# Patient Record
Sex: Male | Born: 1955 | Race: White | Hispanic: No | Marital: Married | Smoking: Never smoker
Health system: Southern US, Community
[De-identification: ages and names within clinical notes are randomized; demographics above are authoritative.]

## PROBLEM LIST (undated history)

## (undated) ENCOUNTER — Ambulatory Visit: Payer: Commercial Managed Care - PPO

## (undated) DIAGNOSIS — I10 Essential (primary) hypertension: Secondary | ICD-10-CM

## (undated) DIAGNOSIS — E119 Type 2 diabetes mellitus without complications: Secondary | ICD-10-CM

## (undated) DIAGNOSIS — Z87442 Personal history of urinary calculi: Secondary | ICD-10-CM

## (undated) DIAGNOSIS — C439 Malignant melanoma of skin, unspecified: Secondary | ICD-10-CM

## (undated) DIAGNOSIS — E785 Hyperlipidemia, unspecified: Secondary | ICD-10-CM

## (undated) DIAGNOSIS — E669 Obesity, unspecified: Secondary | ICD-10-CM

## (undated) DIAGNOSIS — M23359 Other meniscus derangements, posterior horn of lateral meniscus, unspecified knee: Secondary | ICD-10-CM

## (undated) HISTORY — DX: Essential (primary) hypertension: I10

## (undated) HISTORY — DX: Hyperlipidemia, unspecified: E78.5

## (undated) HISTORY — PX: WRIST SURGERY: SHX841

## (undated) HISTORY — DX: Type 2 diabetes mellitus without complications: E11.9

## (undated) HISTORY — DX: Malignant melanoma of skin, unspecified: C43.9

---

## 2013-08-18 DIAGNOSIS — E119 Type 2 diabetes mellitus without complications: Secondary | ICD-10-CM

## 2013-08-18 HISTORY — DX: Type 2 diabetes mellitus without complications: E11.9

## 2016-02-05 DIAGNOSIS — C439 Malignant melanoma of skin, unspecified: Secondary | ICD-10-CM

## 2016-02-05 HISTORY — PX: SKIN BIOPSY: SHX1

## 2016-02-05 HISTORY — DX: Malignant melanoma of skin, unspecified: C43.9

## 2016-02-26 ENCOUNTER — Encounter: Payer: Self-pay | Admitting: General Surgery

## 2016-02-26 ENCOUNTER — Ambulatory Visit (INDEPENDENT_AMBULATORY_CARE_PROVIDER_SITE_OTHER): Payer: BLUE CROSS/BLUE SHIELD | Admitting: General Surgery

## 2016-02-26 DIAGNOSIS — C4361 Malignant melanoma of right upper limb, including shoulder: Secondary | ICD-10-CM

## 2016-02-26 NOTE — Progress Notes (Signed)
Patient ID: [name], male   DOB: 02-09-1956, 60 y.o.   MRN: [id number]  Chief Complaint  Patient presents with  . Other    melanoma    HPI [name] is a 60 y.o. male here today for a evaluation of a right posterior upper arm melanoma.  Biopsy was done 02-05-16 by Dr Aubery Lapping. She also biopsied a place on his back that was dysplasic compound nevus. She is here today with his wife of 72 years, [name].  HPI  Past Medical History  Diagnosis Date  . Melanoma ([place]) 02-05-16    right upper arm  . Type 2 diabetes mellitus ([place]) 2015    Past Surgical History  Procedure Laterality Date  . Skin biopsy Right 02-05-16    melanoma    Family History  Problem Relation Age of Onset  . Cancer Father     lung cancer  . Heart attack Mother     Social History Social History  Substance Use Topics  . Smoking status: Never Smoker   . Smokeless tobacco: Never Used  . Alcohol Use: No    No Known Allergies  Current Outpatient Prescriptions  Medication Sig Dispense Refill  . atorvastatin (LIPITOR) 10 MG tablet TAKE 1 TABLET (10 MG TOTAL) BY MOUTH ONCE DAILY.  3  . b complex vitamins tablet Take 1 tablet by mouth daily.    Marland Kitchen glipiZIDE (GLUCOTROL XL) 10 MG 24 hr tablet TAKE 1 TABLET (10 MG TOTAL) BY MOUTH ONCE DAILY.  3  . lisinopril (PRINIVIL,ZESTRIL) 2.5 MG tablet TAKE 1 TABLET (2.5 MG TOTAL) BY MOUTH ONCE DAILY.  3  . metFORMIN (GLUCOPHAGE) 850 MG tablet TAKE 1 TABLET (850 MG TOTAL) BY MOUTH 3 (THREE) TIMES DAILY.  3  . Multiple Vitamin (MULTIVITAMIN) tablet Take 1 tablet by mouth daily.    . Omega-3 Fatty Acids (FISH OIL OMEGA-3 PO) Take by mouth daily.     No current facility-administered medications for this visit.    Review of Systems Review of Systems  Constitutional: Negative.   Respiratory: Negative.   Cardiovascular: Negative.     Blood pressure 142/74, pulse 68, resp. rate 14, height 5\' 10"  (1.778 m), weight 228 lb (103.42 kg).  Physical Exam Physical  Exam  Constitutional: He is oriented to person, place, and time. He appears well-developed and well-nourished.  HENT:  Mouth/Throat: Oropharynx is clear and moist.  Eyes: Conjunctivae are normal. No scleral icterus.  Neck: Neck supple.  Cardiovascular: Normal rate, regular rhythm and normal heart sounds.   Pulmonary/Chest: Effort normal and breath sounds normal.    Lymphadenopathy:    He has no cervical adenopathy.    He has no axillary adenopathy.       Right: No supraclavicular adenopathy present.  Symmetrical 1 cm axillary nodes bilaterally.  Neurological: He is alert and oriented to person, place, and time.  Skin: Skin is warm and dry.  T 10 left lateral back 1 cm healing area. Right posterior upper arm 1.5 cm healing area. Right posterior shoulder lipoma 3-4 cm. Small skin cyst right neck.   Psychiatric: His behavior is normal.    Data Reviewed Pathology from 02/05/2016 biopsy of the right proximal posterior upper arm showing atypical intra-epidermal and dermal melanocytic proliferation consistent with melanoma, invasive. 0.48 mm thickness. Clark level II/III. No ulceration. PT 18.  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  Assessment    Early melanoma right posterior upper arm.    Plan  The lesion is below the Cipro 0.7 mm cut off that would warrant sentinel node biopsy. Formal reexcision has been recommended. This can be completed under local anesthesia.  The patient has been asked to return  by his dermatologist for reexcision of the dysplastic nevus.     Plan office excision.  PCP:  [facility] Ref:  Dr Aubery Lapping  This information has been scribed by Gaspar Cola CMA.   Robert Bellow 02/26/2016, 10:20 PM

## 2016-02-26 NOTE — Patient Instructions (Addendum)
The patient is aware to call back for any questions or concerns. Plan office excision

## 2016-03-11 ENCOUNTER — Encounter: Payer: Self-pay | Admitting: General Surgery

## 2016-03-11 ENCOUNTER — Ambulatory Visit (INDEPENDENT_AMBULATORY_CARE_PROVIDER_SITE_OTHER): Payer: BLUE CROSS/BLUE SHIELD | Admitting: General Surgery

## 2016-03-11 VITALS — BP 120/62 | HR 68 | Resp 12 | Ht 71.0 in | Wt 230.0 lb

## 2016-03-11 DIAGNOSIS — C4361 Malignant melanoma of right upper limb, including shoulder: Secondary | ICD-10-CM | POA: Diagnosis not present

## 2016-03-11 NOTE — Patient Instructions (Addendum)
Keep area clean Use ice pack on and off for today and tomorrow for comfort Return for suture removal

## 2016-03-11 NOTE — Progress Notes (Signed)
Patient ID: [name], male   DOB: 03/20/1956, 60 y.o.   MRN: [id number]  Chief Complaint  Patient presents with  . Procedure    excision    HPI [name] is a 60 y.o. male.  Here today for excision of melanoma right arm.  HPI  Past Medical History:  Diagnosis Date  . Melanoma ([place]) 02-05-16   right upper arm  . Type 2 diabetes mellitus ([place]) 2015    Past Surgical History:  Procedure Laterality Date  . SKIN BIOPSY Right 02-05-16   melanoma    Family History  Problem Relation Age of Onset  . Cancer Father     lung cancer  . Heart attack Mother     Social History Social History  Substance Use Topics  . Smoking status: Never Smoker  . Smokeless tobacco: Never Used  . Alcohol use No    No Known Allergies  Current Outpatient Prescriptions  Medication Sig Dispense Refill  . atorvastatin (LIPITOR) 10 MG tablet TAKE 1 TABLET (10 MG TOTAL) BY MOUTH ONCE DAILY.  3  . b complex vitamins tablet Take 1 tablet by mouth daily.    Marland Kitchen glipiZIDE (GLUCOTROL XL) 10 MG 24 hr tablet TAKE 1 TABLET (10 MG TOTAL) BY MOUTH ONCE DAILY.  3  . lisinopril (PRINIVIL,ZESTRIL) 2.5 MG tablet TAKE 1 TABLET (2.5 MG TOTAL) BY MOUTH ONCE DAILY.  3  . metFORMIN (GLUCOPHAGE) 850 MG tablet TAKE 1 TABLET (850 MG TOTAL) BY MOUTH 3 (THREE) TIMES DAILY.  3  . Multiple Vitamin (MULTIVITAMIN) tablet Take 1 tablet by mouth daily.    . Omega-3 Fatty Acids (FISH OIL OMEGA-3 PO) Take by mouth daily.     No current facility-administered medications for this visit.     Review of Systems Review of Systems  Constitutional: Negative.   Respiratory: Negative.   Cardiovascular: Negative.     Blood pressure 120/62, pulse 68, resp. rate 12, height 5\' 11"  (1.803 m), weight 230 lb (104.3 kg).  Physical Exam Physical Exam  Constitutional: He is oriented to person, place, and time. He appears well-developed and well-nourished.  Musculoskeletal:       Arms: Neurological: He is alert and oriented to person,  place, and time.  Skin: Skin is warm and dry.  Psychiatric: His behavior is normal.      Assessment    Malignant melanoma right posterior upper arm.    Plan    Indication for wide excision reviewed. The area was outlined and a 3 cm wide, 6 cm long ellipse of skin that was vertically orientated was identified. 20 mL of 0.5% Xylocaine with 0.25% Marcaine with 1-200000 of epinephrine. ChloraPrep was applied to the skin. Through an elliptical incision the area was excised. Scant bleeding was noted. The deep tissue was approximated with interrupted 3-0 Vicryl figure-of-eight sutures. The skin was closed with a running 4-0 nylon simple suture. Telfa and Tegaderm dressing applied.  The patient tolerated the procedure well.  He was encouraged to apply ice intermittently for the next 24 hours for comfort. OTC pain relievers as needed.  Follow-up in 10 days for suture removal.  Guy Begin  PCP Guy Begin Olmed[place] This information has been scribed by Karie Fetch RN, BSN,BC.    [name] 03/11/2016, 4:27 PM

## 2016-03-17 ENCOUNTER — Telehealth: Payer: Self-pay | Admitting: General Surgery

## 2016-03-17 NOTE — Telephone Encounter (Signed)
Notified path showed no residual disease.  Reports essentially no pain.  To f/u on Thursday as scheduled for RN check.

## 2016-03-18 ENCOUNTER — Telehealth: Payer: Self-pay | Admitting: *Deleted

## 2016-03-18 NOTE — Telephone Encounter (Signed)
Notified patient as instructed, patient pleased. Discussed follow-up appointments, patient agrees Pathology faxed as requested.

## 2016-03-18 NOTE — Telephone Encounter (Signed)
-----   Message from Robert Bellow, MD sent at 03/17/2016  8:01 AM EDT ----- No residual melanoma.    Copy to [facility] and PCP

## 2016-03-20 ENCOUNTER — Ambulatory Visit (INDEPENDENT_AMBULATORY_CARE_PROVIDER_SITE_OTHER): Payer: BLUE CROSS/BLUE SHIELD | Admitting: *Deleted

## 2016-03-20 DIAGNOSIS — C4361 Malignant melanoma of right upper limb, including shoulder: Secondary | ICD-10-CM

## 2016-03-20 NOTE — Patient Instructions (Signed)
The patient is aware to call back for any questions or concerns.  

## 2016-03-20 NOTE — Progress Notes (Signed)
Patient came in today for a wound check/suture removal.  The wound is clean, with no signs of infection noted. Follow up as scheduled as needed. Aware of pathology.

## 2016-06-18 ENCOUNTER — Other Ambulatory Visit: Payer: Self-pay | Admitting: Orthopedic Surgery

## 2016-06-18 DIAGNOSIS — M23302 Other meniscus derangements, unspecified lateral meniscus, unspecified knee: Secondary | ICD-10-CM

## 2016-06-18 DIAGNOSIS — M25561 Pain in right knee: Secondary | ICD-10-CM

## 2016-07-01 ENCOUNTER — Ambulatory Visit
Admission: RE | Admit: 2016-07-01 | Discharge: 2016-07-01 | Disposition: A | Discharge status: Home / Self Care | Payer: BLUE CROSS/BLUE SHIELD | Source: Ambulatory Visit | Attending: Orthopedic Surgery | Admitting: Orthopedic Surgery

## 2016-07-01 ENCOUNTER — Encounter (INDEPENDENT_AMBULATORY_CARE_PROVIDER_SITE_OTHER): Payer: Self-pay

## 2016-07-01 DIAGNOSIS — M23302 Other meniscus derangements, unspecified lateral meniscus, unspecified knee: Secondary | ICD-10-CM | POA: Insufficient documentation

## 2016-07-01 DIAGNOSIS — M25561 Pain in right knee: Secondary | ICD-10-CM

## 2016-07-01 DIAGNOSIS — M94261 Chondromalacia, right knee: Secondary | ICD-10-CM | POA: Insufficient documentation

## 2017-03-19 ENCOUNTER — Encounter: Payer: Self-pay | Admitting: *Deleted

## 2017-03-20 ENCOUNTER — Encounter: Payer: Self-pay | Admitting: Anesthesiology

## 2017-03-20 ENCOUNTER — Encounter
Admission: RE | Disposition: A | Discharge status: Home / Self Care | Payer: Self-pay | Source: Ambulatory Visit | Attending: Gastroenterology

## 2017-03-20 ENCOUNTER — Ambulatory Visit
Admission: RE | Admit: 2017-03-20 | Discharge: 2017-03-20 | Disposition: A | Discharge status: Home / Self Care | Payer: BLUE CROSS/BLUE SHIELD | Source: Ambulatory Visit | Attending: Gastroenterology | Admitting: Gastroenterology

## 2017-03-20 ENCOUNTER — Ambulatory Visit: Payer: BLUE CROSS/BLUE SHIELD | Admitting: Anesthesiology

## 2017-03-20 DIAGNOSIS — K621 Rectal polyp: Secondary | ICD-10-CM | POA: Diagnosis not present

## 2017-03-20 DIAGNOSIS — Z1211 Encounter for screening for malignant neoplasm of colon: Secondary | ICD-10-CM | POA: Insufficient documentation

## 2017-03-20 DIAGNOSIS — Z7984 Long term (current) use of oral hypoglycemic drugs: Secondary | ICD-10-CM | POA: Insufficient documentation

## 2017-03-20 DIAGNOSIS — E119 Type 2 diabetes mellitus without complications: Secondary | ICD-10-CM | POA: Diagnosis not present

## 2017-03-20 DIAGNOSIS — D124 Benign neoplasm of descending colon: Secondary | ICD-10-CM | POA: Diagnosis not present

## 2017-03-20 DIAGNOSIS — Z79899 Other long term (current) drug therapy: Secondary | ICD-10-CM | POA: Diagnosis not present

## 2017-03-20 DIAGNOSIS — D123 Benign neoplasm of transverse colon: Secondary | ICD-10-CM | POA: Insufficient documentation

## 2017-03-20 DIAGNOSIS — E669 Obesity, unspecified: Secondary | ICD-10-CM | POA: Insufficient documentation

## 2017-03-20 DIAGNOSIS — Z7982 Long term (current) use of aspirin: Secondary | ICD-10-CM | POA: Diagnosis not present

## 2017-03-20 DIAGNOSIS — Z6832 Body mass index (BMI) 32.0-32.9, adult: Secondary | ICD-10-CM | POA: Insufficient documentation

## 2017-03-20 DIAGNOSIS — K640 First degree hemorrhoids: Secondary | ICD-10-CM | POA: Insufficient documentation

## 2017-03-20 DIAGNOSIS — K5790 Diverticulosis of intestine, part unspecified, without perforation or abscess without bleeding: Secondary | ICD-10-CM | POA: Diagnosis not present

## 2017-03-20 DIAGNOSIS — Z85820 Personal history of malignant melanoma of skin: Secondary | ICD-10-CM | POA: Diagnosis not present

## 2017-03-20 HISTORY — DX: Obesity, unspecified: E66.9

## 2017-03-20 HISTORY — DX: Other meniscus derangements, posterior horn of lateral meniscus, unspecified knee: M23.359

## 2017-03-20 HISTORY — PX: COLONOSCOPY WITH PROPOFOL: SHX5780

## 2017-03-20 LAB — GLUCOSE, CAPILLARY: GLUCOSE-CAPILLARY: 128 mg/dL — AB (ref 65–99)

## 2017-03-20 SURGERY — COLONOSCOPY WITH PROPOFOL
Anesthesia: General

## 2017-03-20 MED ORDER — LIDOCAINE HCL (PF) 2 % IJ SOLN
INTRAMUSCULAR | Status: DC | PRN
  Administered 2017-03-20: 50 mg

## 2017-03-20 MED ORDER — SODIUM CHLORIDE 0.9 % IV SOLN: INTRAVENOUS | Status: DC

## 2017-03-20 MED ORDER — MIDAZOLAM HCL 2 MG/2ML IJ SOLN
INTRAMUSCULAR | Status: AC
  Filled 2017-03-20: qty 2

## 2017-03-20 MED ORDER — SODIUM CHLORIDE 0.9 % IV SOLN
INTRAVENOUS | Status: DC
  Administered 2017-03-20: 1000 mL via INTRAVENOUS

## 2017-03-20 MED ORDER — MIDAZOLAM HCL 5 MG/5ML IJ SOLN
INTRAMUSCULAR | Status: DC | PRN
  Administered 2017-03-20: 2 mg via INTRAVENOUS

## 2017-03-20 MED ORDER — PROPOFOL 500 MG/50ML IV EMUL
INTRAVENOUS | Status: DC | PRN
  Administered 2017-03-20: 75 ug/kg/min via INTRAVENOUS

## 2017-03-20 MED ORDER — PROPOFOL 10 MG/ML IV BOLUS
INTRAVENOUS | Status: AC
  Filled 2017-03-20: qty 20

## 2017-03-20 MED ORDER — FENTANYL CITRATE (PF) 100 MCG/2ML IJ SOLN
INTRAMUSCULAR | Status: DC | PRN
  Administered 2017-03-20 (×2): 50 ug via INTRAVENOUS

## 2017-03-20 MED ORDER — PROPOFOL 500 MG/50ML IV EMUL
INTRAVENOUS | Status: AC
  Filled 2017-03-20: qty 50

## 2017-03-20 MED ORDER — PROPOFOL 10 MG/ML IV BOLUS
INTRAVENOUS | Status: DC | PRN
Start: 2017-03-20 — End: 2017-03-20
  Administered 2017-03-20: 20 mg via INTRAVENOUS
  Administered 2017-03-20: 30 mg via INTRAVENOUS

## 2017-03-20 MED ORDER — FENTANYL CITRATE (PF) 100 MCG/2ML IJ SOLN
INTRAMUSCULAR | Status: AC
  Filled 2017-03-20: qty 2

## 2017-03-20 NOTE — Anesthesia Preprocedure Evaluation (Addendum)
Anesthesia Evaluation  Patient identified by MRN, date of birth, ID band Patient awake    Reviewed: Allergy & Precautions, NPO status , Patient's Chart, lab work & pertinent test results, reviewed documented beta blocker date and time   Airway Mallampati: II  TM Distance: >3 FB     Dental  (+) Chipped, Partial Upper   Pulmonary           Cardiovascular      Neuro/Psych    GI/Hepatic   Endo/Other  diabetes, Type 2  Renal/GU      Musculoskeletal   Abdominal   Peds  Hematology   Anesthesia Other Findings   Reproductive/Obstetrics                            Anesthesia Physical Anesthesia Plan  ASA: III  Anesthesia Plan: General   Post-op Pain Management:    Induction: Intravenous  PONV Risk Score and Plan:   Airway Management Planned:   Additional Equipment:   Intra-op Plan:   Post-operative Plan:   Informed Consent: I have reviewed the patients History and Physical, chart, labs and discussed the procedure including the risks, benefits and alternatives for the proposed anesthesia with the patient or authorized representative who has indicated his/her understanding and acceptance.     Plan Discussed with: CRNA  Anesthesia Plan Comments:         Anesthesia Quick Evaluation

## 2017-03-20 NOTE — H&P (Signed)
Outpatient short stay form Pre-procedure 03/20/2017 11:04 AM [name]  Primary Physician: Dr. Johny Drilling  Reason for visit:  [name]  History of present illness:  [name] He tolerated his prep well. He takes 81 mg aspirin that has been held for A couple of days. He takes no other aspirin products or blood thinning agents.      Current Facility-Administered Medications:  .  0.9 %  sodium chloride infusion, , Intravenous, Continuous, Brandon Sails, Preston, Last Rate: 20 mL/hr at 03/20/17 1011, 1,000 mL at 03/20/17 1011 .  0.9 %  sodium chloride infusion, , Intravenous, Continuous, Brandon Sails, Preston  Prescriptions Prior to Admission  Medication Sig Dispense Refill Last Dose  . aspirin EC 81 MG tablet Take 81 mg by mouth daily.   Past Week at Unknown time  . atorvastatin (LIPITOR) 10 MG tablet TAKE 1 TABLET (10 MG TOTAL) BY MOUTH ONCE DAILY.  3 Past Week at Unknown time  . b complex vitamins tablet Take 1 tablet by mouth daily.   Past Week at Unknown time  . glipiZIDE (GLUCOTROL XL) 10 MG 24 hr tablet TAKE 1 TABLET (10 MG TOTAL) BY MOUTH ONCE DAILY.  3 Past Week at Unknown time  . lisinopril (PRINIVIL,ZESTRIL) 2.5 MG tablet TAKE 1 TABLET (2.5 MG TOTAL) BY MOUTH ONCE DAILY.  3 Past Week at Unknown time  . metFORMIN (GLUCOPHAGE) 850 MG tablet TAKE 1 TABLET (850 MG TOTAL) BY MOUTH 3 (THREE) TIMES DAILY.  3 Past Week at Unknown time  . Multiple Vitamin (MULTIVITAMIN) tablet Take 1 tablet by mouth daily.   Past Week at Unknown time  . Omega-3 Fatty Acids (FISH OIL OMEGA-3 PO) Take by mouth daily.   Past Week at Unknown time     No Known Allergies   Past Medical History:  Diagnosis Date  . 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 Melanoma ([place]) 02-05-16   right upper arm  . Obesity   . Type 2 diabetes mellitus ([place]) 2015    Review of systems:      Physical Exam    Heart and lungs: Regular rate and rhythm without rub or gallop, lungs are bilaterally clear.    HEENT: Normocephalic atraumatic eyes are anicteric    Other:     Pertinant exam for procedure: Soft nontender nondistended bowel sounds positive normoactive.    Planned proceedures: Colonoscopy and indicated procedures. I have discussed the risks benefits and complications of procedures to include not limited to bleeding, infection, perforation and the risk of sedation and the patient wishes to proceed.    Brandon Sails, [facility] 03/20/2017  11:04 AM

## 2017-03-20 NOTE — Op Note (Addendum)
[facility] Patient Name: [name] Procedure Date: 03/20/2017 10:52 AM MRN: [id number] Account #: [id number] Date of Birth: 1955/08/27 Admit Type: Outpatient Age: 61 Room: [facility] ENDO ROOM 1 Gender: Male Note Status: Finalized Procedure:            Colonoscopy Indications:          Screening for colorectal malignant neoplasm, This is                        the patient's first colonoscopy Providers:            Lollie Sails, MD Referring MD:         Wynona Canes. Kym Groom, MD (Referring MD) Medicines:            Monitored Anesthesia Care Complications:        No immediate complications. Procedure:            Pre-Anesthesia Assessment:                       - ASA Grade Assessment: II - A patient with mild                        systemic disease.                       After obtaining informed consent, the colonoscope was                        passed under direct vision. Throughout the procedure,                        the patient's blood pressure, pulse, and oxygen                        saturations were monitored continuously. The                        Colonoscope was introduced through the anus and                        advanced to the the cecum, identified by appendiceal                        orifice and ileocecal valve. The colonoscopy was                        performed with moderate difficulty due to a tortuous                        colon. Successful completion of the procedure was aided                        by changing the patient to a prone position and lavage.                        The quality of the bowel preparation was good except                        the ascending colon was fair. Findings:      A 3 mm polyp was found in  the proximal descending colon. The polyp was       sessile. The polyp was removed with a cold biopsy forceps. Resection and       retrieval were complete.      A 3 mm polyp was found in the proximal transverse  colon. The polyp was       sessile. The polyp was removed with a cold biopsy forceps. Resection and       retrieval were complete.      Two sessile polyps were found in the rectum. The polyps were 1 to 2 mm       in size. These polyps were removed with a cold biopsy forceps. Resection       and retrieval were complete.      Non-bleeding internal hemorrhoids were found during retroflexion and       during anoscopy. The hemorrhoids were small and Grade I (internal       hemorrhoids that do not prolapse).      The digital rectal exam was normal.      A few small-mouthed diverticula were found in the sigmoid colon and       distal descending colon. Impression:           - One 3 mm polyp in the proximal descending colon,                        removed with a cold biopsy forceps. Resected and                        retrieved.                       - One 3 mm polyp in the proximal transverse colon,                        removed with a cold biopsy forceps. Resected and                        retrieved.                       - Two 1 to 2 mm polyps in the rectum, removed with a                        cold biopsy forceps. Resected and retrieved.                       - Non-bleeding internal hemorrhoids. Recommendation:       - Discharge patient to home.                       - Advance diet as tolerated. Procedure Code(s):    --- Professional ---                       [phone], Colonoscopy, flexible; with biopsy, single or                        multiple Diagnosis Code(s):    --- Professional ---                       Z12.11, Encounter for screening for malignant neoplasm  of colon                       D12.4, Benign neoplasm of descending colon                       D12.3, Benign neoplasm of transverse colon (hepatic                        flexure or splenic flexure)                       K62.1, Rectal polyp                       K64.0, First degree hemorrhoids CPT  copyright 2016 American Medical Association. All rights reserved. The codes documented in this report are preliminary and upon coder review may  be revised to meet current compliance requirements. Lollie Sails, MD 03/20/2017 11:44:54 AM This report has been signed electronically. Number of Addenda: 0 Note Initiated On: 03/20/2017 10:52 AM Scope Withdrawal Time: 0 hours 11 minutes 18 seconds  Total Procedure Duration: 0 hours 28 minutes 50 seconds       [facility]

## 2017-03-20 NOTE — Anesthesia Post-op Follow-up Note (Cosign Needed)
Anesthesia QCDR form completed.        

## 2017-03-20 NOTE — Transfer of Care (Signed)
Immediate Anesthesia Transfer of Care Note  Patient: [name]  Procedure(s) Performed: Procedure(s): COLONOSCOPY WITH PROPOFOL (N/A)  Patient Location: PACU  Anesthesia Type:General  Level of Consciousness: sedated  Airway & Oxygen Therapy: Patient Spontanous Breathing and Patient connected to nasal cannula oxygen  Post-op Assessment: Report given to RN and Post -op Vital signs reviewed and stable  Post vital signs: Reviewed and stable  Last Vitals:  Vitals:   03/20/17 0959  BP: (!) 143/78  Pulse: 99  Resp: 16  Temp: (!) 36.1 C    Last Pain:  Vitals:   03/20/17 0959  TempSrc: Tympanic         Complications: No apparent anesthesia complications

## 2017-03-20 NOTE — Anesthesia Postprocedure Evaluation (Signed)
Anesthesia Post Note  Patient: [name]  Procedure(s) Performed: Procedure(s) (LRB): COLONOSCOPY WITH PROPOFOL (N/A)  Patient location during evaluation: Endoscopy Anesthesia Type: General Level of consciousness: awake and alert Pain management: pain level controlled Vital Signs Assessment: post-procedure vital signs reviewed and stable Respiratory status: spontaneous breathing, nonlabored ventilation, respiratory function stable and patient connected to nasal cannula oxygen Cardiovascular status: blood pressure returned to baseline and stable Postop Assessment: no signs of nausea or vomiting Anesthetic complications: no     Last Vitals:  Vitals:   03/20/17 1142 03/20/17 1152  BP: 102/69 110/79  Pulse: 83 78  Resp: 14 13  Temp: (!) 35.7 C     Last Pain:  Vitals:   03/20/17 1142  TempSrc: Tympanic                 Vickie Ponds S

## 2017-03-23 ENCOUNTER — Encounter: Payer: Self-pay | Admitting: Gastroenterology

## 2017-03-23 LAB — SURGICAL PATHOLOGY

## 2021-06-12 ENCOUNTER — Other Ambulatory Visit: Payer: Self-pay

## 2021-06-12 ENCOUNTER — Other Ambulatory Visit: Payer: Self-pay | Admitting: Family Medicine

## 2021-06-12 ENCOUNTER — Ambulatory Visit
Admission: RE | Admit: 2021-06-12 | Discharge: 2021-06-12 | Disposition: A | Discharge status: Home / Self Care | Payer: Commercial Managed Care - PPO | Source: Ambulatory Visit | Attending: Family Medicine | Admitting: Family Medicine

## 2021-06-12 ENCOUNTER — Other Ambulatory Visit (HOSPITAL_COMMUNITY): Payer: Self-pay | Admitting: Family Medicine

## 2021-06-12 DIAGNOSIS — R109 Unspecified abdominal pain: Secondary | ICD-10-CM

## 2021-06-12 DIAGNOSIS — R1030 Lower abdominal pain, unspecified: Secondary | ICD-10-CM | POA: Diagnosis not present

## 2021-06-13 NOTE — H&P (View-Only) (Signed)
06/14/2021 10:57 AM   [name] 09/15/1955 [id number]  Referring provider: Valera Castle, MD [address] [place],  [place] [address]  Chief Complaint  Patient presents with   Hydronephrosis    HPI: 65 year old male who presents today for further evaluation of ureteral calculus.  He underwent a noncontrast CT scan tow days ago which showed mild left hydronephrosis and hydroureter secondary to 6 mm left proximal ureteral calculus.  He reports today that his pain started about 1 weeks ago while [place] the [place].  It was severe and comes and goes.  Yesterday was bad day.    No N/V/ fevers, chills.  Possible very remote history of stones 30+ years ago passes spontaneously.    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 If you have any questions, ask your nurse or doctor.          aspirin EC 81 MG tablet Take 81 mg by mouth daily.   atorvastatin 40 MG tablet Commonly known as: LIPITOR Take 40 mg by mouth daily. What changed: Another medication with the same name was removed. Continue taking this medication, and follow the directions you see here. Changed by: Hollice Espy, MD   b complex vitamins tablet Take 1 tablet by mouth daily.   FISH OIL OMEGA-3  PO Take by mouth daily.   glipiZIDE 10 MG 24 hr tablet Commonly known as: GLUCOTROL XL TAKE 1 TABLET (10 MG TOTAL) BY MOUTH ONCE DAILY.   ibuprofen 800 MG tablet Commonly known as: ADVIL Take 800 mg by mouth every 8 (eight) hours as needed.   lisinopril 20 MG tablet Commonly known as: ZESTRIL Take 20 mg by mouth 2 (two) times daily. What changed: Another medication with the same name was removed. Continue taking this medication, and follow the directions you see here. Changed by: Hollice Espy, MD   metFORMIN 850 MG tablet Commonly known as: GLUCOPHAGE TAKE 1 TABLET (850 MG TOTAL) BY MOUTH 3 (THREE) TIMES DAILY.   multivitamin tablet Take 1 tablet by mouth daily.   tamsulosin 0.4 MG Caps capsule Commonly known as: FLOMAX Take by mouth.        Allergies: No Known Allergies  Family History: Family History  Problem Relation Age of Onset   Heart attack Mother    Cancer Father        lung cancer    Social History:  reports that he has never smoked. He has never used smokeless tobacco. He reports that he does not drink alcohol and does not use drugs.   Physical Exam: BP 116/71   Pulse 86   Ht 5\' 10"  (1.778 m)   Wt  235 lb (106.6 kg)   BMI 33.72 kg/m   Constitutional:  Alert and oriented, No acute distress. HEENT: [place] AT, moist mucus membranes.  Trachea midline, no masses. Cardiovascular: No clubbing, cyanosis, or edema. Respiratory: Normal respiratory effort, no increased work of breathing. Skin: No rashes, bruises or suspicious lesions. Neurologic: Grossly intact, no focal deficits, moving all 4 extremities. Psychiatric: Normal mood and affect.  Laboratory Data: Cr 1.0 05/20/21  Urinalysis pending  Pertinent Imaging: CT RENAL STONE STUDY  Narrative CLINICAL DATA:  Left lower abdominal pain  EXAM: CT ABDOMEN AND PELVIS WITHOUT CONTRAST  TECHNIQUE: Multidetector CT imaging of the abdomen and pelvis was performed following the standard protocol  without IV contrast.  COMPARISON:  None.  FINDINGS: Lower chest: No acute abnormality.  Coronary vascular calcification.  Hepatobiliary: No focal liver abnormality is seen. No gallstones, gallbladder wall thickening, or biliary dilatation.  Pancreas: Unremarkable. No pancreatic ductal dilatation or surrounding inflammatory changes.  Spleen: Normal in size without focal abnormality.  Adrenals/Urinary Tract: Adrenal glands are normal. Mild left hydronephrosis and hydroureter, secondary to a 3 x 6 mm stone in the proximal left ureter about 4 cm distal to the left UPJ. The urinary bladder is unremarkable.  Stomach/Bowel: Stomach is within normal limits. Appendix appears normal. No evidence of bowel wall thickening, distention, or inflammatory changes.  Vascular/Lymphatic: Mild aortic atherosclerosis. No aneurysm. No suspicious nodes.  Reproductive: Prostate is unremarkable.  Other: Negative for pelvic effusion or free air. Small fat containing inguinal hernias. Small fat containing periumbilical hernia.  Musculoskeletal: No acute or significant osseous findings.  IMPRESSION: 1. Mild left hydronephrosis and hydroureter secondary to a 3 x 6 mm stone in the proximal left ureter about 4 cm distal to the left UVJ.  These results will be called to the ordering clinician or representative by the Radiologist Assistant, and communication documented in the PACS or [employer].   Electronically Signed By: Donavan Foil M.D. On: 06/12/2021 17:43  CT scan personally reviewed, agree with radiological interpretation.  This was reviewed with the patient as well today in the office.    Assessment & Plan:    1. Left ureteral stone 6 mm prox/ mid ureteral stone without infection  We discussed various treatment options for urolithiasis including observation with or without medical expulsive therapy, shockwave lithotripsy (SWL), ureteroscopy and laser lithotripsy with stent  placement, and percutaneous nephrolithotomy.   We discussed that management is based on stone size, location, density, patient co-morbidities, and patient preference.    Stones <40mm in size have a >80% spontaneous passage rate. Data surrounding the use of tamsulosin for medical expulsive therapy is controversial, but meta analyses suggests it is most efficacious for distal stones between 5-65mm in size. Possible side effects include dizziness/lightheadedness, and retrograde ejaculation.   SWL has a lower stone free rate in a single procedure, but also a lower complication rate compared to ureteroscopy and avoids a stent and associated stent related symptoms. Possible complications include renal hematoma, steinstrasse, and need for additional treatment. We discussed the role of his increased skin to stone distance can lead to decreased efficacy with shockwave lithotripsy.   Ureteroscopy with laser lithotripsy and stent placement has a higher stone free rate than SWL in a single procedure, however increased complication rate including possible infection, ureteral injury, bleeding, and stent related morbidity. Common stent related symptoms include dysuria, urgency/frequency, and flank pain.   After an extensive discussion of the risks and benefits of the above treatment options, the patient would  like to proceed with left ESWL  Warning symptoms reviewed, all questions answered  - Urinalysis, Complete w Microscopic (For [facility] ONLY); Future - Urine culture; Future   Hollice Espy, MD  [facility] [address] [place], [place] [address] [phone]

## 2021-06-13 NOTE — Progress Notes (Signed)
06/14/2021 10:57 AM   [name] 08/24/1955 [id number]  Referring provider: Valera Castle, MD [address] [place],  [place] [address]  Chief Complaint  Patient presents with   Hydronephrosis    HPI: 65 year old male who presents today for further evaluation of ureteral calculus.  He underwent a noncontrast CT scan tow days ago which showed mild left hydronephrosis and hydroureter secondary to 6 mm left proximal ureteral calculus.  He reports today that his pain started about 1 weeks ago while [place] the mountains.  It was severe and comes and goes.  Yesterday was bad day.    No N/V/ fevers, chills.  Possible very remote history of stones 30+ years ago passes spontaneously.    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 If you have any questions, ask your nurse or doctor.          aspirin EC 81 MG tablet Take 81 mg by mouth daily.   atorvastatin 40 MG tablet Commonly known as: LIPITOR Take 40 mg by mouth daily. What changed: Another medication with the same name was removed. Continue taking this medication, and follow the directions you see here. Changed by: Hollice Espy, MD   b complex vitamins tablet Take 1 tablet by mouth daily.   FISH OIL OMEGA-3  PO Take by mouth daily.   glipiZIDE 10 MG 24 hr tablet Commonly known as: GLUCOTROL XL TAKE 1 TABLET (10 MG TOTAL) BY MOUTH ONCE DAILY.   ibuprofen 800 MG tablet Commonly known as: ADVIL Take 800 mg by mouth every 8 (eight) hours as needed.   lisinopril 20 MG tablet Commonly known as: ZESTRIL Take 20 mg by mouth 2 (two) times daily. What changed: Another medication with the same name was removed. Continue taking this medication, and follow the directions you see here. Changed by: Hollice Espy, MD   metFORMIN 850 MG tablet Commonly known as: GLUCOPHAGE TAKE 1 TABLET (850 MG TOTAL) BY MOUTH 3 (THREE) TIMES DAILY.   multivitamin tablet Take 1 tablet by mouth daily.   tamsulosin 0.4 MG Caps capsule Commonly known as: FLOMAX Take by mouth.        Allergies: No Known Allergies  Family History: Family History  Problem Relation Age of Onset   Heart attack Mother    Cancer Father        lung cancer    Social History:  reports that he has never smoked. He has never used smokeless tobacco. He reports that he does not drink alcohol and does not use drugs.   Physical Exam: BP 116/71   Pulse 86   Ht 5\' 10"  (1.778 m)   Wt  235 lb (106.6 kg)   BMI 33.72 kg/m   Constitutional:  Alert and oriented, No acute distress. HEENT: [place] AT, moist mucus membranes.  Trachea midline, no masses. Cardiovascular: No clubbing, cyanosis, or edema. Respiratory: Normal respiratory effort, no increased work of breathing. Skin: No rashes, bruises or suspicious lesions. Neurologic: Grossly intact, no focal deficits, moving all 4 extremities. Psychiatric: Normal mood and affect.  Laboratory Data: Cr 1.0 05/20/21  Urinalysis pending  Pertinent Imaging: CT RENAL STONE STUDY  Narrative CLINICAL DATA:  Left lower abdominal pain  EXAM: CT ABDOMEN AND PELVIS WITHOUT CONTRAST  TECHNIQUE: Multidetector CT imaging of the abdomen and pelvis was performed following the standard protocol  without IV contrast.  COMPARISON:  None.  FINDINGS: Lower chest: No acute abnormality.  Coronary vascular calcification.  Hepatobiliary: No focal liver abnormality is seen. No gallstones, gallbladder wall thickening, or biliary dilatation.  Pancreas: Unremarkable. No pancreatic ductal dilatation or surrounding inflammatory changes.  Spleen: Normal in size without focal abnormality.  Adrenals/Urinary Tract: Adrenal glands are normal. Mild left hydronephrosis and hydroureter, secondary to a 3 x 6 mm stone in the proximal left ureter about 4 cm distal to the left UPJ. The urinary bladder is unremarkable.  Stomach/Bowel: Stomach is within normal limits. Appendix appears normal. No evidence of bowel wall thickening, distention, or inflammatory changes.  Vascular/Lymphatic: Mild aortic atherosclerosis. No aneurysm. No suspicious nodes.  Reproductive: Prostate is unremarkable.  Other: Negative for pelvic effusion or free air. Small fat containing inguinal hernias. Small fat containing periumbilical hernia.  Musculoskeletal: No acute or significant osseous findings.  IMPRESSION: 1. Mild left hydronephrosis and hydroureter secondary to a 3 x 6 mm stone in the proximal left ureter about 4 cm distal to the left UVJ.  These results will be called to the ordering clinician or representative by the Radiologist Assistant, and communication documented in the PACS or [employer].   Electronically Signed By: Donavan Foil M.D. On: 06/12/2021 17:43  CT scan personally reviewed, agree with radiological interpretation.  This was reviewed with the patient as well today in the office.    Assessment & Plan:    1. Left ureteral stone 6 mm prox/ mid ureteral stone without infection  We discussed various treatment options for urolithiasis including observation with or without medical expulsive therapy, shockwave lithotripsy (SWL), ureteroscopy and laser lithotripsy with stent  placement, and percutaneous nephrolithotomy.   We discussed that management is based on stone size, location, density, patient co-morbidities, and patient preference.    Stones <71mm in size have a >80% spontaneous passage rate. Data surrounding the use of tamsulosin for medical expulsive therapy is controversial, but meta analyses suggests it is most efficacious for distal stones between 5-42mm in size. Possible side effects include dizziness/lightheadedness, and retrograde ejaculation.   SWL has a lower stone free rate in a single procedure, but also a lower complication rate compared to ureteroscopy and avoids a stent and associated stent related symptoms. Possible complications include renal hematoma, steinstrasse, and need for additional treatment. We discussed the role of his increased skin to stone distance can lead to decreased efficacy with shockwave lithotripsy.   Ureteroscopy with laser lithotripsy and stent placement has a higher stone free rate than SWL in a single procedure, however increased complication rate including possible infection, ureteral injury, bleeding, and stent related morbidity. Common stent related symptoms include dysuria, urgency/frequency, and flank pain.   After an extensive discussion of the risks and benefits of the above treatment options, the patient would  like to proceed with left ESWL  Warning symptoms reviewed, all questions answered  - Urinalysis, Complete w Microscopic (For [facility] ONLY); Future - Urine culture; Future   Hollice Espy, MD  [facility] [address] [place], [place] [address] [phone]

## 2021-06-14 ENCOUNTER — Ambulatory Visit: Payer: Commercial Managed Care - PPO | Admitting: Urology

## 2021-06-14 ENCOUNTER — Encounter: Payer: Self-pay | Admitting: Urology

## 2021-06-14 ENCOUNTER — Other Ambulatory Visit: Payer: Self-pay

## 2021-06-14 ENCOUNTER — Other Ambulatory Visit: Payer: Self-pay | Admitting: Urology

## 2021-06-14 VITALS — BP 116/71 | HR 86 | Ht 70.0 in | Wt 235.0 lb

## 2021-06-14 DIAGNOSIS — N201 Calculus of ureter: Secondary | ICD-10-CM

## 2021-06-14 NOTE — Progress Notes (Signed)
ESWL ORDER FORM  Expected date of procedure: 06/20/21  Surgeon: Hollice Espy, MD  Post op standing: 2-4wk follow up w/KUB prior  Anticoagulation/Aspirin/NSAID standing order: Hold all 72 hours prior  Anesthesia standing order: MAC  VTE standing: SCD's  Dx: Left Ureteral Stone  Procedure: left Extracorporeal shock wave lithotripsy  CPT : 41583  Standing Order Set:   *NPO after mn, KUB  *NS 133m/hr, Keflex 504mPO, Benadryl 2537mO, Valium 8m56m, Zofran 4mg 60m   Medications if other than standing orders:   NONE

## 2021-06-20 ENCOUNTER — Ambulatory Visit: Payer: Commercial Managed Care - PPO

## 2021-06-20 ENCOUNTER — Other Ambulatory Visit: Payer: Self-pay

## 2021-06-20 ENCOUNTER — Ambulatory Visit
Admission: RE | Admit: 2021-06-20 | Discharge: 2021-06-20 | Disposition: A | Discharge status: Home / Self Care | Payer: Commercial Managed Care - PPO | Attending: Urology | Admitting: Urology

## 2021-06-20 ENCOUNTER — Encounter
Admission: RE | Disposition: A | Discharge status: Home / Self Care | Payer: Self-pay | Source: Home / Self Care | Attending: Urology

## 2021-06-20 ENCOUNTER — Telehealth: Payer: Self-pay | Admitting: Urology

## 2021-06-20 ENCOUNTER — Encounter: Payer: Self-pay | Admitting: Urology

## 2021-06-20 DIAGNOSIS — Z6833 Body mass index (BMI) 33.0-33.9, adult: Secondary | ICD-10-CM | POA: Insufficient documentation

## 2021-06-20 DIAGNOSIS — N201 Calculus of ureter: Secondary | ICD-10-CM

## 2021-06-20 DIAGNOSIS — E119 Type 2 diabetes mellitus without complications: Secondary | ICD-10-CM | POA: Diagnosis not present

## 2021-06-20 DIAGNOSIS — Z7984 Long term (current) use of oral hypoglycemic drugs: Secondary | ICD-10-CM | POA: Diagnosis not present

## 2021-06-20 DIAGNOSIS — E669 Obesity, unspecified: Secondary | ICD-10-CM | POA: Diagnosis not present

## 2021-06-20 DIAGNOSIS — N132 Hydronephrosis with renal and ureteral calculous obstruction: Secondary | ICD-10-CM | POA: Insufficient documentation

## 2021-06-20 DIAGNOSIS — Z79899 Other long term (current) drug therapy: Secondary | ICD-10-CM | POA: Insufficient documentation

## 2021-06-20 DIAGNOSIS — Z7982 Long term (current) use of aspirin: Secondary | ICD-10-CM | POA: Insufficient documentation

## 2021-06-20 HISTORY — PX: EXTRACORPOREAL SHOCK WAVE LITHOTRIPSY: SHX1557

## 2021-06-20 LAB — GLUCOSE, CAPILLARY: Glucose-Capillary: 102 mg/dL — ABNORMAL HIGH (ref 70–99)

## 2021-06-20 SURGERY — LITHOTRIPSY, ESWL
Anesthesia: General | Laterality: Left

## 2021-06-20 MED ORDER — HYDROCODONE-ACETAMINOPHEN 5-325 MG PO TABS: 1.0000 | ORAL_TABLET | Freq: Four times a day (QID) | ORAL | 0 refills | Status: DC | PRN

## 2021-06-20 MED ORDER — ONDANSETRON HCL 4 MG/2ML IJ SOLN: 4.0000 mg | Freq: Once | INTRAMUSCULAR | Status: AC

## 2021-06-20 MED ORDER — DIPHENHYDRAMINE HCL 25 MG PO CAPS: 25.0000 mg | ORAL_CAPSULE | ORAL | Status: AC

## 2021-06-20 MED ORDER — DIPHENHYDRAMINE HCL 25 MG PO CAPS
ORAL_CAPSULE | ORAL | Status: AC
  Administered 2021-06-20: 25 mg via ORAL
  Filled 2021-06-20: qty 1

## 2021-06-20 MED ORDER — CEPHALEXIN 500 MG PO CAPS: 500.0000 mg | ORAL_CAPSULE | Freq: Once | ORAL | Status: AC

## 2021-06-20 MED ORDER — CEPHALEXIN 500 MG PO CAPS
ORAL_CAPSULE | ORAL | Status: AC
  Administered 2021-06-20: 500 mg via ORAL
  Filled 2021-06-20: qty 1

## 2021-06-20 MED ORDER — CHLORHEXIDINE GLUCONATE 0.12 % MT SOLN
OROMUCOSAL | Status: AC
  Filled 2021-06-20: qty 15

## 2021-06-20 MED ORDER — ONDANSETRON HCL 4 MG/2ML IJ SOLN
INTRAMUSCULAR | Status: AC
  Administered 2021-06-20: 4 mg via INTRAVENOUS
  Filled 2021-06-20: qty 2

## 2021-06-20 MED ORDER — DIAZEPAM 5 MG PO TABS
ORAL_TABLET | ORAL | Status: AC
  Administered 2021-06-20: 10 mg via ORAL
  Filled 2021-06-20: qty 2

## 2021-06-20 MED ORDER — SODIUM CHLORIDE 0.9 % IV SOLN: INTRAVENOUS | Status: DC

## 2021-06-20 MED ORDER — DIAZEPAM 5 MG PO TABS
10.0000 mg | ORAL_TABLET | ORAL | Status: AC
Start: 2021-06-20 — End: 2021-06-20

## 2021-06-20 NOTE — Telephone Encounter (Signed)
Mr. [name] was very difficult to see at the time of lithotripsy however there was a clear transition in the proximal ureter at the location of the Preston on previous CT when contrast was administered.  We ended up treating the transition point rather than visualization of the Preston.  Traditionally, will follow up with KUB in 2 weeks however I do not believe that this will be effective in assessing whether or not the Preston is passed.  I prefer he follow-up in 4 weeks with a renal ultrasound prior similar to ureteroscopy.  He may eventually also need a Preston CT.  Please change follow-up to 4 weeks of renal ultrasound prior.  Hollice Espy, MD

## 2021-06-20 NOTE — Telephone Encounter (Signed)
Rescheduled with Sam on 12/1 and placed the order for the Renal US.

## 2021-06-20 NOTE — Discharge Instructions (Addendum)
See [facility] discharge instructions in chart. AMBULATORY SURGERY  DISCHARGE INSTRUCTIONS   The drugs that you were given will stay in your system until tomorrow so for the next 24 hours you should not:  Drive an automobile Make any legal decisions Drink any alcoholic beverage   You may resume regular meals tomorrow.  Today it is better to start with liquids and gradually work up to solid foods.  You may eat anything you prefer, but it is better to start with liquids, then soup and crackers, and gradually work up to solid foods.   Please notify your doctor immediately if you have any unusual bleeding, trouble breathing, redness and pain at the surgery site, drainage, fever, or pain not relieved by medication.    Additional Instructions:        Please contact your physician with any problems or Same Day Surgery at [phone], Monday through Friday 6 am to 4 pm, or [facility] number at [phone].  

## 2021-06-20 NOTE — Interval H&P Note (Signed)
History and Physical Interval Note:  06/20/2021 10:28 AM  [name]  has presented today for surgery, with the diagnosis of left ureteral stone.  The various methods of treatment have been discussed with the patient and family. After consideration of risks, benefits and other options for treatment, the patient has consented to  Procedure(s): EXTRACORPOREAL SHOCK WAVE LITHOTRIPSY (ESWL) (Left) as a surgical intervention.  The patient's history has been reviewed, patient examined, no change in status, stable for surgery.  I have reviewed the patient's chart and labs.  Questions were answered to the patient's satisfaction.    RRR CTAB  Hollice Espy

## 2021-07-09 ENCOUNTER — Ambulatory Visit: Payer: Commercial Managed Care - PPO | Admitting: Urology

## 2021-07-15 ENCOUNTER — Other Ambulatory Visit: Payer: Self-pay

## 2021-07-15 ENCOUNTER — Ambulatory Visit
Admission: RE | Admit: 2021-07-15 | Discharge: 2021-07-15 | Disposition: A | Discharge status: Home / Self Care | Payer: Commercial Managed Care - PPO | Source: Ambulatory Visit | Attending: Urology | Admitting: Urology

## 2021-07-15 DIAGNOSIS — N201 Calculus of ureter: Secondary | ICD-10-CM | POA: Insufficient documentation

## 2021-07-18 ENCOUNTER — Encounter: Payer: Commercial Managed Care - PPO | Admitting: Physician Assistant

## 2021-07-22 ENCOUNTER — Other Ambulatory Visit: Payer: Self-pay

## 2021-07-22 ENCOUNTER — Encounter: Payer: Self-pay | Admitting: Physician Assistant

## 2021-07-22 ENCOUNTER — Ambulatory Visit (INDEPENDENT_AMBULATORY_CARE_PROVIDER_SITE_OTHER): Payer: Commercial Managed Care - PPO | Admitting: Physician Assistant

## 2021-07-22 VITALS — BP 170/77 | HR 78 | Ht 70.0 in | Wt 240.0 lb

## 2021-07-22 DIAGNOSIS — N201 Calculus of ureter: Secondary | ICD-10-CM | POA: Diagnosis not present

## 2021-07-22 LAB — MICROSCOPIC EXAMINATION
Bacteria, UA: NONE SEEN
Epithelial Cells (non renal): NONE SEEN /hpf (ref 0–10)

## 2021-07-22 LAB — URINALYSIS, COMPLETE
Bilirubin, UA: NEGATIVE
Glucose, UA: NEGATIVE
Ketones, UA: NEGATIVE
Leukocytes,UA: NEGATIVE
Nitrite, UA: NEGATIVE
Protein,UA: NEGATIVE
Specific Gravity, UA: 1.015 (ref 1.005–1.030)
Urobilinogen, Ur: 4 mg/dL — ABNORMAL HIGH (ref 0.2–1.0)
pH, UA: 6.5 (ref 5.0–7.5)

## 2021-07-22 NOTE — Progress Notes (Signed)
07/22/2021 2:20 PM   [name] 1956/06/16 [id number]  CC: Chief Complaint  Patient presents with   Nephrolithiasis   HPI: [name] is a 65 y.o. male who underwent ESWL with Dr. Erlene Quan on 06/20/2021 for management of a 6 mm proximal left ureteral stone who presents today for postop follow-up.  Notably, his stone was not particularly radiopaque, however Dr. Erlene Quan was able to treat the transition point after contrast was administered.  After treatment, the contrast was seen descending down the ureter.   Today he reports he strained his urine for 3 weeks after the procedure but never saw any stones pass.  His flank pain has resolved.  Renal ultrasound dated 07/15/2021 with no hydronephrosis, however the ureteral jets were not captured.  In-office UA today positive for trace intact blood and 4.0 EU/DL urobilinogen; urine microscopy with 6-10 WBCs/HPF.  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    Melanoma ([place]) 02/05/2016   right upper arm   Obesity    Type 2 diabetes mellitus ([place]) 2015    Surgical History: Past Surgical History:  Procedure Laterality Date   COLONOSCOPY WITH PROPOFOL N/A 03/20/2017   Procedure: COLONOSCOPY WITH PROPOFOL;  Surgeon: Lollie Sails, MD;  Location: [facility] ENDOSCOPY;  Service: Endoscopy;  Laterality: N/A;   EXTRACORPOREAL SHOCK WAVE LITHOTRIPSY Left 06/20/2021   Procedure: EXTRACORPOREAL SHOCK WAVE LITHOTRIPSY (ESWL);  Surgeon: Hollice Espy, MD;  Location: [facility] ORS;  Service: Urology;  Laterality: Left;   SKIN BIOPSY Right 02-05-16   melanoma   WRIST SURGERY      Home Medications:  Allergies as of 07/22/2021   No Known Allergies      Medication List        Accurate as of July 22, 2021  2:20 PM. If you have any questions, ask your nurse or doctor.          aspirin EC 81 MG tablet Take 81 mg by mouth daily.    atorvastatin 40 MG tablet Commonly known as: LIPITOR Take 40 mg by mouth daily.   b complex vitamins tablet Take 1 tablet by mouth daily.   FISH OIL OMEGA-3 PO Take by mouth daily.   glipiZIDE 10 MG 24 hr tablet Commonly known as: GLUCOTROL XL TAKE 1 TABLET (10 MG TOTAL) BY MOUTH ONCE DAILY.   HYDROcodone-acetaminophen 5-325 MG tablet Commonly known as: NORCO/VICODIN Take 1-2 tablets by mouth every 6 (six) hours as needed for moderate pain.   ibuprofen 800 MG tablet Commonly known as: ADVIL Take 800 mg by mouth every 8 (eight) hours as needed.   lisinopril 20 MG tablet Commonly known as: ZESTRIL Take 20 mg by mouth 2 (two) times daily.   metFORMIN 850 MG tablet Commonly known as: GLUCOPHAGE TAKE 1 TABLET (850 MG TOTAL) BY MOUTH 3 (THREE) TIMES DAILY.   multivitamin tablet Take 1 tablet by mouth daily.   tamsulosin 0.4 MG Caps capsule Commonly known as: FLOMAX Take by mouth.        Allergies:  No Known Allergies  Family History: Family History  Problem Relation Age of Onset   Heart attack Mother    Cancer Father        lung cancer    Social History:   reports that he has never smoked. He has never used smokeless tobacco. He reports that he does not drink alcohol and does not  use drugs.  Physical Exam: BP (!) 170/77   Pulse 78   Ht 5\' 10"  (1.778 m)   Wt 240 lb (108.9 kg)   BMI 34.44 kg/m   Constitutional:  Alert and oriented, no acute distress, nontoxic appearing HEENT: [place], AT Cardiovascular: No clubbing, cyanosis, or edema Respiratory: Normal respiratory effort, no increased work of breathing Skin: No rashes, bruises or suspicious lesions Neurologic: Grossly intact, no focal deficits, moving all 4 extremities Psychiatric: Normal mood and affect  Laboratory Data: Results for orders placed or performed in visit on 07/22/21  Microscopic Examination   Urine  Result Value Ref Range   WBC, UA 6-10 (A) 0 - 5 /hpf   RBC 0-2 0 - 2 /hpf   Epithelial  Cells (non renal) None seen 0 - 10 /hpf   Bacteria, UA None seen None seen/Few  Urinalysis, Complete  Result Value Ref Range   Specific Gravity, UA 1.015 1.005 - 1.030   pH, UA 6.5 5.0 - 7.5   Color, UA Yellow Yellow   Appearance Ur Cloudy (A) Clear   Leukocytes,UA Negative Negative   Protein,UA Negative Negative/Trace   Glucose, UA Negative Negative   Ketones, UA Negative Negative   RBC, UA Trace (A) Negative   Bilirubin, UA Negative Negative   Urobilinogen, Ur 4.0 (H) 0.2 - 1.0 mg/dL   Nitrite, UA Negative Negative   Microscopic Examination See below:    Pertinent Imaging: Results for orders placed during the hospital encounter of 07/15/21  Ultrasound renal complete  Narrative CLINICAL DATA:  Left ureteral stone  EXAM: RENAL / URINARY TRACT ULTRASOUND COMPLETE  COMPARISON:  None.  FINDINGS: Right Kidney:  Renal measurements: 11 x 6.5 x 5.3 cm = volume: 199.4 mL. Echogenicity within normal limits. No mass or hydronephrosis visualized.  Left Kidney:  Renal measurements: 12.4 x 5.8 x 5.1 cm = volume: 190.4 mL. Echogenicity within normal limits. No mass or hydronephrosis visualized.  Bladder:  Appears normal for degree of bladder distention.  Other:  None.  IMPRESSION: No hydronephrosis or proximal hydroureter.   Electronically Signed By: Macy Mis M.D. On: 07/16/2021 09:15  I personally reviewed the images referenced above and note no hydronephrosis, however the ureteral jets are not evaluated.  Assessment & Plan:   1. Left ureteral stone Poorly visualized on KUB.  Hydronephrosis has resolved on renal ultrasound, however patient never saw any fragments passed despite diligent straining and he remains with some mild pyuria today.  I recommended a CT stone study today for definitive evaluation of possible retained/fragments and he agreed.  No indication for urgent imaging today given that patient is asymptomatic and VSS.  We will call him with  results. - Urinalysis, Complete - CT RENAL STONE STUDY; Future   Return for Will call with results.  Debroah Loop, PA-C  [facility] [address] [place], [place] [address] [phone]

## 2021-08-13 ENCOUNTER — Ambulatory Visit: Payer: Commercial Managed Care - PPO

## 2021-08-27 ENCOUNTER — Ambulatory Visit
Admission: RE | Admit: 2021-08-27 | Discharge: 2021-08-27 | Disposition: A | Discharge status: Home / Self Care | Payer: Commercial Managed Care - PPO | Source: Ambulatory Visit | Attending: Physician Assistant | Admitting: Physician Assistant

## 2021-08-27 ENCOUNTER — Other Ambulatory Visit: Payer: Self-pay

## 2021-08-27 DIAGNOSIS — N201 Calculus of ureter: Secondary | ICD-10-CM

## 2021-09-09 NOTE — Progress Notes (Signed)
09/09/21 7:33 PM   [name] [birth year] [id number]  Referring provider:  [facility], [place],  [place] [address] No chief complaint on file.    HPI: [name] is a 66 y.o.male with a personal history of 6 mm proximal left ureteral stone who presents today for CT results discussion.   CT renal stone study on 06/12/2021 revealed mild left hydronephrosis and hydroureter secondary to a 3 x 6 mm stone in the proximal left ureter about 4 cm distal to the left UVJ.  He is s/p ESWL on 06/20/2021.   RUS on 07/15/2021 revealed no hydronephrosis or proximal hydroureter,   CT renal stone study fore flank pain on 08/27/2021 revealed 6 mm left ureteral calculus is again noted which has migrated significantly more distally since prior exam, and is now noted in the distal left ureter. No significant hydronephrosis or ureteral dilatation is noted.   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    Melanoma ([place]) 02/05/2016   right upper arm   Obesity    Type 2 diabetes mellitus ([place]) 2015    Surgical History: Past Surgical History:  Procedure Laterality Date   COLONOSCOPY WITH PROPOFOL N/A 03/20/2017   Procedure: COLONOSCOPY WITH PROPOFOL;  Surgeon: Lollie Sails, MD;  Location: [facility] ENDOSCOPY;  Service: Endoscopy;  Laterality: N/A;   EXTRACORPOREAL SHOCK WAVE LITHOTRIPSY Left 06/20/2021   Procedure: EXTRACORPOREAL SHOCK WAVE LITHOTRIPSY (ESWL);  Surgeon: Hollice Espy, MD;  Location: [facility] ORS;  Service: Urology;  Laterality: Left;   SKIN BIOPSY Right 02-05-16   melanoma   WRIST SURGERY      Home Medications:  Allergies as of 09/10/2021   No Known Allergies      Medication List        Accurate as of September 09, 2021  7:33 PM. If you have any questions, ask your nurse or doctor.          aspirin EC 81 MG tablet Take 81 mg by  mouth daily.   atorvastatin 40 MG tablet Commonly known as: LIPITOR Take 40 mg by mouth daily.   b complex vitamins tablet Take 1 tablet by mouth daily.   FISH OIL OMEGA-3 PO Take by mouth daily.   glipiZIDE 10 MG 24 hr tablet Commonly known as: GLUCOTROL XL TAKE 1 TABLET (10 MG TOTAL) BY MOUTH ONCE DAILY.   HYDROcodone-acetaminophen 5-325 MG tablet Commonly known as: NORCO/VICODIN Take 1-2 tablets by mouth every 6 (six) hours as needed for moderate pain.   ibuprofen 800 MG tablet Commonly known as: ADVIL Take 800 mg by mouth every 8 (eight) hours as needed.   lisinopril 20 MG tablet Commonly known as: ZESTRIL Take 20 mg by mouth 2 (two) times daily.   metFORMIN 850 MG tablet Commonly known as: GLUCOPHAGE TAKE 1 TABLET (850 MG TOTAL) BY MOUTH 3 (THREE) TIMES DAILY.   multivitamin tablet Take 1 tablet by mouth daily.   tamsulosin 0.4 MG Caps capsule Commonly known as: FLOMAX Take by mouth.        Allergies: No Known Allergies  Family History: Family History  Problem Relation Age of Onset   Heart attack Mother    Cancer Father        lung cancer    Social History:  reports that he has never smoked. He has never used smokeless tobacco. He reports  that he does not drink alcohol and does not use drugs.   Physical Exam: There were no vitals taken for this visit.  Constitutional:  Alert and oriented, No acute distress. HEENT: [place] AT, moist mucus membranes.  Trachea midline, no masses. Cardiovascular: No clubbing, cyanosis, or edema. Respiratory: Normal respiratory effort, no increased work of breathing. Skin: No rashes, bruises or suspicious lesions. Neurologic: Grossly intact, no focal deficits, moving all 4 extremities. Psychiatric: Normal mood and affect.  Pertinent Imaging: CLINICAL DATA:  Flank pain.   EXAM: CT ABDOMEN AND PELVIS WITHOUT CONTRAST   TECHNIQUE: Multidetector CT imaging of the abdomen and pelvis was performed following the standard  protocol without IV contrast.   COMPARISON:  June 12, 2021.   FINDINGS: Lower chest: No acute abnormality.   Hepatobiliary: No focal liver abnormality is seen. No gallstones, gallbladder wall thickening, or biliary dilatation.   Pancreas: Unremarkable. No pancreatic ductal dilatation or surrounding inflammatory changes.   Spleen: Normal in size without focal abnormality.   Adrenals/Urinary Tract: Adrenal glands appear normal. No hydronephrosis or renal obstruction is noted. 6 mm linear left ureteral calculus is noted which has migrated significantly distally into the distal left ureter since prior exam. Small nonobstructive right renal calculus is noted. Urinary bladder is unremarkable.   Stomach/Bowel: Stomach is within normal limits. Appendix appears normal. No evidence of bowel wall thickening, distention, or inflammatory changes.   Vascular/Lymphatic: Aortic atherosclerosis. No enlarged abdominal or pelvic lymph nodes.   Reproductive: Prostate is unremarkable.   Other: Small fat containing periumbilical hernia is noted. No ascites is noted.   Musculoskeletal: No acute or significant osseous findings.   IMPRESSION: 6 mm left ureteral calculus is again noted which has migrated significantly more distally since prior exam, and is now noted in the distal left ureter. No significant hydronephrosis or ureteral dilatation is noted.   Small fat containing periumbilical hernia.   Aortic Atherosclerosis (ICD10-I70.0).     Electronically Signed   By: Marijo Conception M.D.   On: 08/27/2021 13:00   Assessment & Plan:    Left ureteral stone  - CT visualized stone burden 6 mm left ureteral calculus, asymptomatic -Failed ESWL - Recommend ureteroscopy - Risks and benefits of ureteroscopy were reviewed including but not limited to infection, bleeding, pain, ureteral injury which could require open surgery versus prolonged indwelling if ureteral perforation occurs,  persistent stone disease, requirement for staged procedure, possible stent, and global anesthesia risks. Patient expressed understanding and desires to proceed with ureteroscopy. - Urine sent for pre-op culture   Return for ureteroscopy   [place] [address], [place], [place] [address] [phone]

## 2021-09-09 NOTE — H&P (View-Only) (Signed)
09/09/21 7:33 PM   [name] 1955-12-07 [id number]  Referring provider:  [facility], [place],  [place] [address] No chief complaint on file.    HPI: [name] is a 66 y.o.male with a personal history of 6 mm proximal left ureteral stone who presents today for CT results discussion.   CT renal stone study on 06/12/2021 revealed mild left hydronephrosis and hydroureter secondary to a 3 x 6 mm stone in the proximal left ureter about 4 cm distal to the left UVJ.  He is s/p ESWL on 06/20/2021.   RUS on 07/15/2021 revealed no hydronephrosis or proximal hydroureter,   CT renal stone study fore flank pain on 08/27/2021 revealed 6 mm left ureteral calculus is again noted which has migrated significantly more distally since prior exam, and is now noted in the distal left ureter. No significant hydronephrosis or ureteral dilatation is noted.   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    Melanoma ([place]) 02/05/2016   right upper arm   Obesity    Type 2 diabetes mellitus ([place]) 2015    Surgical History: Past Surgical History:  Procedure Laterality Date   COLONOSCOPY WITH PROPOFOL N/A 03/20/2017   Procedure: COLONOSCOPY WITH PROPOFOL;  Surgeon: Lollie Sails, MD;  Location: [facility] ENDOSCOPY;  Service: Endoscopy;  Laterality: N/A;   EXTRACORPOREAL SHOCK WAVE LITHOTRIPSY Left 06/20/2021   Procedure: EXTRACORPOREAL SHOCK WAVE LITHOTRIPSY (ESWL);  Surgeon: Hollice Espy, MD;  Location: [facility] ORS;  Service: Urology;  Laterality: Left;   SKIN BIOPSY Right 02-05-16   melanoma   WRIST SURGERY      Home Medications:  Allergies as of 09/10/2021   No Known Allergies      Medication List        Accurate as of September 09, 2021  7:33 PM. If you have any questions, ask your nurse or doctor.          aspirin EC 81 MG tablet Take 81 mg by  mouth daily.   atorvastatin 40 MG tablet Commonly known as: LIPITOR Take 40 mg by mouth daily.   b complex vitamins tablet Take 1 tablet by mouth daily.   FISH OIL OMEGA-3 PO Take by mouth daily.   glipiZIDE 10 MG 24 hr tablet Commonly known as: GLUCOTROL XL TAKE 1 TABLET (10 MG TOTAL) BY MOUTH ONCE DAILY.   HYDROcodone-acetaminophen 5-325 MG tablet Commonly known as: NORCO/VICODIN Take 1-2 tablets by mouth every 6 (six) hours as needed for moderate pain.   ibuprofen 800 MG tablet Commonly known as: ADVIL Take 800 mg by mouth every 8 (eight) hours as needed.   lisinopril 20 MG tablet Commonly known as: ZESTRIL Take 20 mg by mouth 2 (two) times daily.   metFORMIN 850 MG tablet Commonly known as: GLUCOPHAGE TAKE 1 TABLET (850 MG TOTAL) BY MOUTH 3 (THREE) TIMES DAILY.   multivitamin tablet Take 1 tablet by mouth daily.   tamsulosin 0.4 MG Caps capsule Commonly known as: FLOMAX Take by mouth.        Allergies: No Known Allergies  Family History: Family History  Problem Relation Age of Onset   Heart attack Mother    Cancer Father        lung cancer    Social History:  reports that he has never smoked. He has never used smokeless tobacco. He reports  that he does not drink alcohol and does not use drugs.   Physical Exam: There were no vitals taken for this visit.  Constitutional:  Alert and oriented, No acute distress. HEENT: [place] AT, moist mucus membranes.  Trachea midline, no masses. Cardiovascular: No clubbing, cyanosis, or edema. Respiratory: Normal respiratory effort, no increased work of breathing. Skin: No rashes, bruises or suspicious lesions. Neurologic: Grossly intact, no focal deficits, moving all 4 extremities. Psychiatric: Normal mood and affect.  Pertinent Imaging: CLINICAL DATA:  Flank pain.   EXAM: CT ABDOMEN AND PELVIS WITHOUT CONTRAST   TECHNIQUE: Multidetector CT imaging of the abdomen and pelvis was performed following the standard  protocol without IV contrast.   COMPARISON:  June 12, 2021.   FINDINGS: Lower chest: No acute abnormality.   Hepatobiliary: No focal liver abnormality is seen. No gallstones, gallbladder wall thickening, or biliary dilatation.   Pancreas: Unremarkable. No pancreatic ductal dilatation or surrounding inflammatory changes.   Spleen: Normal in size without focal abnormality.   Adrenals/Urinary Tract: Adrenal glands appear normal. No hydronephrosis or renal obstruction is noted. 6 mm linear left ureteral calculus is noted which has migrated significantly distally into the distal left ureter since prior exam. Small nonobstructive right renal calculus is noted. Urinary bladder is unremarkable.   Stomach/Bowel: Stomach is within normal limits. Appendix appears normal. No evidence of bowel wall thickening, distention, or inflammatory changes.   Vascular/Lymphatic: Aortic atherosclerosis. No enlarged abdominal or pelvic lymph nodes.   Reproductive: Prostate is unremarkable.   Other: Small fat containing periumbilical hernia is noted. No ascites is noted.   Musculoskeletal: No acute or significant osseous findings.   IMPRESSION: 6 mm left ureteral calculus is again noted which has migrated significantly more distally since prior exam, and is now noted in the distal left ureter. No significant hydronephrosis or ureteral dilatation is noted.   Small fat containing periumbilical hernia.   Aortic Atherosclerosis (ICD10-I70.0).     Electronically Signed   By: Marijo Conception M.D.   On: 08/27/2021 13:00   Assessment & Plan:    Left ureteral stone  - CT visualized stone burden 6 mm left ureteral calculus, asymptomatic -Failed ESWL - Recommend ureteroscopy - Risks and benefits of ureteroscopy were reviewed including but not limited to infection, bleeding, pain, ureteral injury which could require open surgery versus prolonged indwelling if ureteral perforation occurs,  persistent stone disease, requirement for staged procedure, possible stent, and global anesthesia risks. Patient expressed understanding and desires to proceed with ureteroscopy. - Urine sent for pre-op culture   Return for ureteroscopy   [place] [facility] [address], [place], [place] [address] [phone]

## 2021-09-10 ENCOUNTER — Ambulatory Visit: Payer: Commercial Managed Care - PPO | Admitting: Urology

## 2021-09-10 ENCOUNTER — Other Ambulatory Visit: Payer: Self-pay

## 2021-09-10 ENCOUNTER — Encounter: Payer: Self-pay | Admitting: Urology

## 2021-09-10 VITALS — BP 167/82 | HR 68 | Ht 70.0 in | Wt 247.0 lb

## 2021-09-10 DIAGNOSIS — N201 Calculus of ureter: Secondary | ICD-10-CM

## 2021-09-10 LAB — URINALYSIS, COMPLETE
Bilirubin, UA: NEGATIVE
Glucose, UA: NEGATIVE
Ketones, UA: NEGATIVE
Leukocytes,UA: NEGATIVE
Nitrite, UA: NEGATIVE
Protein,UA: NEGATIVE
Specific Gravity, UA: 1.01 (ref 1.005–1.030)
Urobilinogen, Ur: 0.2 mg/dL (ref 0.2–1.0)
pH, UA: 6 (ref 5.0–7.5)

## 2021-09-10 LAB — MICROSCOPIC EXAMINATION
Bacteria, UA: NONE SEEN
Epithelial Cells (non renal): NONE SEEN /hpf (ref 0–10)

## 2021-09-13 LAB — CULTURE, URINE COMPREHENSIVE

## 2021-09-17 ENCOUNTER — Other Ambulatory Visit: Payer: Self-pay

## 2021-09-17 ENCOUNTER — Telehealth: Payer: Self-pay | Admitting: Urology

## 2021-09-17 DIAGNOSIS — N201 Calculus of ureter: Secondary | ICD-10-CM

## 2021-09-17 NOTE — Progress Notes (Signed)
[facility] Posting Form   Surgery Date/Time: Date: 10/07/2021  Surgeon: Dr. Hollice Espy, MD  Surgery Location: Day Surgery  Inpt ( No  )   Outpt (Yes)   Obs ( No  )   Diagnosis: N20.1 Left Ureteral Stone  -CPT: [phone]  Surgery: Left Ureteroscopy with laser lithotripsy and stent placement  Stop Anticoagulations: Yes, may continue ASA  Cardiac/Medical/Pulmonary Clearance needed: no  *Orders entered into EPIC  Date: 09/17/21   *Case booked in [place]  Date: 09/17/21  *Notified pt of Surgery: Date: 09/17/21  *Placed into Prior Authorization Work Fabio Bering Date: 09/17/21   Assistant/laser/rep:No

## 2021-09-17 NOTE — Telephone Encounter (Signed)
I spoke with Mr. [name]. We have discussed possible surgery dates and Monday February 20th, 2023 was agreed upon by all parties. Patient given information about surgery date, what to expect pre-operatively and post operatively.   We discussed that a Pre-Admission Testing office will be calling to set up the pre-op visit that will take place prior to surgery, and that these appointments are typically done over the phone with a Pre-Admissions RN.   Informed patient that our office will communicate any additional care to be provided after surgery. Patients questions or concerns were discussed during our call. Advised to call our office should there be any additional information, questions or concerns that arise. Patient verbalized understanding.

## 2021-09-17 NOTE — Telephone Encounter (Signed)
Surgical Physician Order Form [facility] [place]  * Scheduling expectation : Next Available  *Length of Case:   *Clearance needed: no  *Anticoagulation Instructions: Hold all anticoagulants  *Aspirin Instructions: Ok to continue Aspirin  *Post-op visit Date/Instructions:  1 month with RUS prior  *Diagnosis: Left Ureteral Stone  *Procedure: left Ureteroscopy w/laser lithotripsy & stent placement ([id number])   Additional orders: N/A  -Admit type: OUTpatient  -Anesthesia: General  -VTE Prophylaxis Standing Order SCDs       Other:   -Standing Lab Orders Per Anesthesia    Lab other: None  -Standing Test orders EKG/Chest x-ray per Anesthesia       Test other:   - Medications:  Ancef 2gm IV  -Other orders:  N/A

## 2021-10-01 ENCOUNTER — Other Ambulatory Visit
Admission: RE | Admit: 2021-10-01 | Discharge: 2021-10-01 | Disposition: A | Discharge status: Home / Self Care | Payer: Commercial Managed Care - PPO | Source: Ambulatory Visit | Attending: Urology | Admitting: Urology

## 2021-10-01 ENCOUNTER — Other Ambulatory Visit: Payer: Self-pay

## 2021-10-01 DIAGNOSIS — Z01812 Encounter for preprocedural laboratory examination: Secondary | ICD-10-CM

## 2021-10-01 HISTORY — DX: Personal history of urinary calculi: Z87.442

## 2021-10-01 NOTE — Patient Instructions (Addendum)
Your procedure is scheduled on: 10/07/21 - Monday Report to the Registration Desk on the 1st floor of the [facility]. To find out your arrival time, please call [phone] between 1PM - 3PM on: 10/04/21 - Friday   REMEMBER: Instructions that are not followed completely may result in serious medical risk, up to and including death; or upon the discretion of your surgeon and anesthesiologist your surgery may need to be rescheduled.  Do not eat food or drink any fluids after midnight the night before surgery.  No gum chewing, lozengers or hard candies.  TAKE THESE MEDICATIONS THE MORNING OF SURGERY WITH A SIP OF WATER:  - atorvastatin (LIPITOR) 40 MG tablet  - tamsulosin (FLOMAX) 0.4 MG CAPS capsule   Follow recommendations from Cardiologist, Pulmonologist or PCP regarding stopping Aspirin, Coumadin, Plavix, Eliquis, Pradaxa, or Pletal.  Do Not Take metFORMIN (GLUCOPHAGE) 850 MG tablet on 02/18, 02/19 and do not take the day of surgery.  One week prior to surgery: Stop Anti-inflammatories (NSAIDS) such as Advil, Aleve, Ibuprofen, Motrin, Naproxen, Naprosyn and Aspirin based products such as Excedrin, Goodys Powder, BC Powder.  Stop ANY OVER THE COUNTER supplements until after surgery.Omega-3 Fatty Acids (FISH OIL OMEGA-3 PO), Multiple Vitamin (MULTIVITAMIN) tablet  You may  take Tylenol if needed for pain up until the day of surgery.  No Alcohol for 24 hours before or after surgery.  No Smoking including e-cigarettes for 24 hours prior to surgery.  No chewable tobacco products for at least 6 hours prior to surgery.  No nicotine patches on the day of surgery.  Do not use any "recreational" drugs for at least a week prior to your surgery.  Please be advised that the combination of cocaine and anesthesia may have negative outcomes, up to and including death. If you test positive for cocaine, your surgery will be cancelled.  On the morning of surgery brush your teeth with  toothpaste and water, you may rinse your mouth with mouthwash if you wish. Do not swallow any toothpaste or mouthwash.  Do not wear jewelry, make-up, hairpins, clips or nail polish.  Do not wear lotions, powders, or perfumes.   Do not shave body from the neck down 48 hours prior to surgery just in case you cut yourself which could leave a site for infection.  Also, freshly shaved skin may become irritated if using the CHG soap.  Contact lenses, hearing aids and dentures may not be worn into surgery.  Do not bring valuables to the hospital. [facility] is not responsible for any missing/lost belongings or valuables.   Notify your doctor if there is any change in your medical condition (cold, fever, infection).  Wear comfortable clothing (specific to your surgery type) to the hospital.  After surgery, you can help prevent lung complications by doing breathing exercises.  Take deep breaths and cough every 1-2 hours. Your doctor may order a device called an Incentive Spirometer to help you take deep breaths. When coughing or sneezing, hold a pillow firmly against your incision with both hands. This is called splinting. Doing this helps protect your incision. It also decreases belly discomfort.  If you are being admitted to the hospital overnight, leave your suitcase in the car. After surgery it may be brought to your room.  If you are being discharged the day of surgery, you will not be allowed to drive home. You will need a responsible adult (18 years or older) to drive you home and stay with you that night.  If you are taking public transportation, you will need to have a responsible adult (18 years or older) with you. Please confirm with your physician that it is acceptable to use public transportation.   Please call the [facility] at [phone] if you have any questions about these instructions.  Surgery Visitation Policy:  Patients undergoing a surgery or  procedure may have one family member or support person with them as long as that person is not COVID-19 positive or experiencing its symptoms.  That person may remain in the waiting area during the procedure and may rotate out with other people.  Inpatient Visitation:    Visiting hours are 7 a.m. to 8 p.m. Up to two visitors ages 16+ are allowed at one time in a patient room. The visitors may rotate out with other people during the day. Visitors must check out when they leave, or other visitors will not be allowed. One designated support person may remain overnight. The visitor must pass COVID-19 screenings, use hand sanitizer when entering and exiting the patients room and wear a mask at all times, including in the patients room. Patients must also wear a mask when staff or their visitor are in the room. Masking is required regardless of vaccination status.

## 2021-10-03 ENCOUNTER — Other Ambulatory Visit
Admission: RE | Admit: 2021-10-03 | Discharge: 2021-10-03 | Disposition: A | Discharge status: Home / Self Care | Payer: Commercial Managed Care - PPO | Source: Ambulatory Visit | Attending: Urology | Admitting: Urology

## 2021-10-03 ENCOUNTER — Encounter: Payer: Self-pay | Admitting: Urgent Care

## 2021-10-03 ENCOUNTER — Other Ambulatory Visit: Payer: Self-pay

## 2021-10-03 DIAGNOSIS — Z01812 Encounter for preprocedural laboratory examination: Secondary | ICD-10-CM

## 2021-10-03 DIAGNOSIS — Z01818 Encounter for other preprocedural examination: Secondary | ICD-10-CM | POA: Diagnosis not present

## 2021-10-03 LAB — CBC
HCT: 40.1 % (ref 39.0–52.0)
Hemoglobin: 13.4 g/dL (ref 13.0–17.0)
MCH: 28.8 pg (ref 26.0–34.0)
MCHC: 33.4 g/dL (ref 30.0–36.0)
MCV: 86.1 fL (ref 80.0–100.0)
Platelets: 322 10*3/uL (ref 150–400)
RBC: 4.66 MIL/uL (ref 4.22–5.81)
RDW: 13.1 % (ref 11.5–15.5)
WBC: 8.4 10*3/uL (ref 4.0–10.5)
nRBC: 0 % (ref 0.0–0.2)

## 2021-10-06 MED ORDER — FAMOTIDINE 20 MG PO TABS: 20.0000 mg | ORAL_TABLET | Freq: Once | ORAL | Status: AC

## 2021-10-06 MED ORDER — CHLORHEXIDINE GLUCONATE 0.12 % MT SOLN: 15.0000 mL | Freq: Once | OROMUCOSAL | Status: AC

## 2021-10-06 MED ORDER — ORAL CARE MOUTH RINSE: 15.0000 mL | Freq: Once | OROMUCOSAL | Status: AC

## 2021-10-06 MED ORDER — CEFAZOLIN SODIUM-DEXTROSE 2-4 GM/100ML-% IV SOLN
2.0000 g | INTRAVENOUS | Status: AC
  Administered 2021-10-07: 2 g via INTRAVENOUS

## 2021-10-06 MED ORDER — SODIUM CHLORIDE 0.9 % IV SOLN: INTRAVENOUS | Status: DC

## 2021-10-07 ENCOUNTER — Other Ambulatory Visit: Payer: Self-pay

## 2021-10-07 ENCOUNTER — Encounter: Payer: Self-pay | Admitting: Urology

## 2021-10-07 ENCOUNTER — Ambulatory Visit: Payer: Commercial Managed Care - PPO | Admitting: Certified Registered"

## 2021-10-07 ENCOUNTER — Encounter
Admission: RE | Disposition: A | Discharge status: Home / Self Care | Payer: Self-pay | Source: Home / Self Care | Attending: Urology

## 2021-10-07 ENCOUNTER — Ambulatory Visit: Payer: Commercial Managed Care - PPO

## 2021-10-07 ENCOUNTER — Ambulatory Visit
Admission: RE | Admit: 2021-10-07 | Discharge: 2021-10-07 | Disposition: A | Discharge status: Home / Self Care | Payer: Commercial Managed Care - PPO | Attending: Urology | Admitting: Urology

## 2021-10-07 DIAGNOSIS — I10 Essential (primary) hypertension: Secondary | ICD-10-CM | POA: Insufficient documentation

## 2021-10-07 DIAGNOSIS — Z6835 Body mass index (BMI) 35.0-35.9, adult: Secondary | ICD-10-CM | POA: Insufficient documentation

## 2021-10-07 DIAGNOSIS — E785 Hyperlipidemia, unspecified: Secondary | ICD-10-CM | POA: Diagnosis not present

## 2021-10-07 DIAGNOSIS — E119 Type 2 diabetes mellitus without complications: Secondary | ICD-10-CM | POA: Diagnosis not present

## 2021-10-07 DIAGNOSIS — N201 Calculus of ureter: Secondary | ICD-10-CM | POA: Diagnosis not present

## 2021-10-07 DIAGNOSIS — E669 Obesity, unspecified: Secondary | ICD-10-CM | POA: Insufficient documentation

## 2021-10-07 HISTORY — PX: CYSTOSCOPY W/ RETROGRADES: SHX1426

## 2021-10-07 HISTORY — PX: CYSTOSCOPY/URETEROSCOPY/HOLMIUM LASER/STENT PLACEMENT: SHX6546

## 2021-10-07 LAB — GLUCOSE, CAPILLARY
Glucose-Capillary: 157 mg/dL — ABNORMAL HIGH (ref 70–99)
Glucose-Capillary: 126 mg/dL — ABNORMAL HIGH (ref 70–99)

## 2021-10-07 SURGERY — CYSTOSCOPY/URETEROSCOPY/HOLMIUM LASER/STENT PLACEMENT
Anesthesia: General | Laterality: Left

## 2021-10-07 MED ORDER — ONDANSETRON HCL 4 MG/2ML IJ SOLN
INTRAMUSCULAR | Status: AC
  Filled 2021-10-07: qty 2

## 2021-10-07 MED ORDER — OXYCODONE HCL 5 MG PO TABS: 5.0000 mg | ORAL_TABLET | Freq: Once | ORAL | Status: DC | PRN

## 2021-10-07 MED ORDER — MIDAZOLAM HCL 2 MG/2ML IJ SOLN
INTRAMUSCULAR | Status: DC | PRN
Start: 2021-10-07 — End: 2021-10-07
  Administered 2021-10-07: 2 mg via INTRAVENOUS

## 2021-10-07 MED ORDER — PHENYLEPHRINE HCL (PRESSORS) 10 MG/ML IV SOLN
INTRAVENOUS | Status: AC
  Filled 2021-10-07: qty 1

## 2021-10-07 MED ORDER — PROPOFOL 10 MG/ML IV BOLUS
INTRAVENOUS | Status: DC | PRN
  Administered 2021-10-07: 150 mg via INTRAVENOUS

## 2021-10-07 MED ORDER — HYDROCODONE-ACETAMINOPHEN 5-325 MG PO TABS
1.0000 | ORAL_TABLET | Freq: Four times a day (QID) | ORAL | 0 refills | Status: AC | PRN
Start: 2021-10-07 — End: ?

## 2021-10-07 MED ORDER — CEFAZOLIN SODIUM-DEXTROSE 2-4 GM/100ML-% IV SOLN
INTRAVENOUS | Status: AC
  Filled 2021-10-07: qty 100

## 2021-10-07 MED ORDER — KETOROLAC TROMETHAMINE 30 MG/ML IJ SOLN
INTRAMUSCULAR | Status: AC
  Filled 2021-10-07: qty 1

## 2021-10-07 MED ORDER — ONDANSETRON HCL 4 MG/2ML IJ SOLN
INTRAMUSCULAR | Status: DC | PRN
  Administered 2021-10-07: 4 mg via INTRAVENOUS

## 2021-10-07 MED ORDER — PROPOFOL 10 MG/ML IV BOLUS
INTRAVENOUS | Status: AC
  Filled 2021-10-07: qty 20

## 2021-10-07 MED ORDER — LIDOCAINE HCL (PF) 2 % IJ SOLN
INTRAMUSCULAR | Status: AC
  Filled 2021-10-07: qty 5

## 2021-10-07 MED ORDER — EPHEDRINE SULFATE-NACL 50-0.9 MG/10ML-% IV SOSY
PREFILLED_SYRINGE | INTRAVENOUS | Status: DC | PRN
  Administered 2021-10-07: 10 mg via INTRAVENOUS

## 2021-10-07 MED ORDER — PHENYLEPHRINE HCL (PRESSORS) 10 MG/ML IV SOLN
INTRAVENOUS | Status: DC | PRN
  Administered 2021-10-07 (×2): 100 ug via INTRAVENOUS

## 2021-10-07 MED ORDER — ONDANSETRON HCL 4 MG/2ML IJ SOLN: 4.0000 mg | Freq: Once | INTRAMUSCULAR | Status: DC | PRN

## 2021-10-07 MED ORDER — CHLORHEXIDINE GLUCONATE 0.12 % MT SOLN
OROMUCOSAL | Status: AC
  Administered 2021-10-07: 15 mL via OROMUCOSAL
  Filled 2021-10-07: qty 15

## 2021-10-07 MED ORDER — OXYCODONE HCL 5 MG/5ML PO SOLN: 5.0000 mg | Freq: Once | ORAL | Status: DC | PRN

## 2021-10-07 MED ORDER — MIDAZOLAM HCL 2 MG/2ML IJ SOLN
INTRAMUSCULAR | Status: AC
  Filled 2021-10-07: qty 2

## 2021-10-07 MED ORDER — ACETAMINOPHEN 10 MG/ML IV SOLN: 1000.0000 mg | Freq: Once | INTRAVENOUS | Status: DC | PRN

## 2021-10-07 MED ORDER — FENTANYL CITRATE (PF) 100 MCG/2ML IJ SOLN
INTRAMUSCULAR | Status: DC | PRN
Start: 2021-10-07 — End: 2021-10-07
  Administered 2021-10-07: 25 ug via INTRAVENOUS
  Administered 2021-10-07: 50 ug via INTRAVENOUS
  Administered 2021-10-07: 25 ug via INTRAVENOUS

## 2021-10-07 MED ORDER — LIDOCAINE HCL (CARDIAC) PF 100 MG/5ML IV SOSY
PREFILLED_SYRINGE | INTRAVENOUS | Status: DC | PRN
Start: 2021-10-07 — End: 2021-10-07
  Administered 2021-10-07: 100 mg via INTRAVENOUS

## 2021-10-07 MED ORDER — DEXAMETHASONE SODIUM PHOSPHATE 10 MG/ML IJ SOLN
INTRAMUSCULAR | Status: AC
  Filled 2021-10-07: qty 1

## 2021-10-07 MED ORDER — KETOROLAC TROMETHAMINE 30 MG/ML IJ SOLN
INTRAMUSCULAR | Status: DC | PRN
  Administered 2021-10-07: 30 mg via INTRAVENOUS

## 2021-10-07 MED ORDER — FENTANYL CITRATE (PF) 100 MCG/2ML IJ SOLN
INTRAMUSCULAR | Status: AC
Start: 2021-10-07 — End: ?
  Filled 2021-10-07: qty 2

## 2021-10-07 MED ORDER — GLYCOPYRROLATE 0.2 MG/ML IJ SOLN
INTRAMUSCULAR | Status: DC | PRN
  Administered 2021-10-07: .2 mg via INTRAVENOUS

## 2021-10-07 MED ORDER — FAMOTIDINE 20 MG PO TABS
ORAL_TABLET | ORAL | Status: AC
  Administered 2021-10-07: 20 mg via ORAL
  Filled 2021-10-07: qty 1

## 2021-10-07 MED ORDER — EPHEDRINE 5 MG/ML INJ
INTRAVENOUS | Status: AC
  Filled 2021-10-07: qty 5

## 2021-10-07 MED ORDER — FENTANYL CITRATE (PF) 100 MCG/2ML IJ SOLN: 25.0000 ug | INTRAMUSCULAR | Status: DC | PRN

## 2021-10-07 MED ORDER — DEXAMETHASONE SODIUM PHOSPHATE 10 MG/ML IJ SOLN
INTRAMUSCULAR | Status: DC | PRN
  Administered 2021-10-07: 5 mg via INTRAVENOUS

## 2021-10-07 MED ORDER — OXYBUTYNIN CHLORIDE 5 MG PO TABS: 5.0000 mg | ORAL_TABLET | Freq: Three times a day (TID) | ORAL | 0 refills | Status: AC | PRN

## 2021-10-07 SURGICAL SUPPLY — 33 items
ADH LQ OCL WTPRF AMP STRL LF (MISCELLANEOUS)
ADHESIVE MASTISOL STRL (MISCELLANEOUS) IMPLANT
BAG DRAIN CYSTO-URO LG1000N (MISCELLANEOUS) ×3 IMPLANT
BASKET ZERO TIP 1.9FR (BASKET) ×1 IMPLANT
BRUSH SCRUB EZ 1% IODOPHOR (MISCELLANEOUS) ×3 IMPLANT
BSKT STON RTRVL ZERO TP 1.9FR (BASKET) ×2
CATH URET FLEX-TIP 2 LUMEN 10F (CATHETERS) IMPLANT
CATH URETL OPEN 5X70 (CATHETERS) ×3 IMPLANT
CNTNR SPEC 2.5X3XGRAD LEK (MISCELLANEOUS)
CONT SPEC 4OZ STER OR WHT (MISCELLANEOUS)
CONT SPEC 4OZ STRL OR WHT (MISCELLANEOUS)
CONTAINER SPEC 2.5X3XGRAD LEK (MISCELLANEOUS) IMPLANT
DRAPE UTILITY 15X26 TOWEL STRL (DRAPES) ×3 IMPLANT
DRSG TEGADERM 2-3/8X2-3/4 SM (GAUZE/BANDAGES/DRESSINGS) IMPLANT
GAUZE 4X4 16PLY ~~LOC~~+RFID DBL (SPONGE) ×4 IMPLANT
GLOVE SURG ENC MOIS LTX SZ6.5 (GLOVE) ×3 IMPLANT
GOWN STRL REUS W/ TWL LRG LVL3 (GOWN DISPOSABLE) ×4 IMPLANT
GOWN STRL REUS W/TWL LRG LVL3 (GOWN DISPOSABLE) ×6
GUIDEWIRE GREEN .038 145CM (MISCELLANEOUS) IMPLANT
GUIDEWIRE STR DUAL SENSOR (WIRE) ×3 IMPLANT
INFUSOR MANOMETER BAG 3000ML (MISCELLANEOUS) ×3 IMPLANT
IV NS IRRIG 3000ML ARTHROMATIC (IV SOLUTION) ×3 IMPLANT
KIT TURNOVER CYSTO (KITS) ×3 IMPLANT
MANIFOLD NEPTUNE II (INSTRUMENTS) ×3 IMPLANT
PACK CYSTO AR (MISCELLANEOUS) ×3 IMPLANT
SET CYSTO W/LG BORE CLAMP LF (SET/KITS/TRAYS/PACK) ×3 IMPLANT
SHEATH URETERAL 12FRX35CM (MISCELLANEOUS) IMPLANT
STENT URET 6FRX24 CONTOUR (STENTS) IMPLANT
STENT URET 6FRX26 CONTOUR (STENTS) ×1 IMPLANT
SURGILUBE 2OZ TUBE FLIPTOP (MISCELLANEOUS) ×3 IMPLANT
TRACTIP FLEXIVA PULSE ID 200 (Laser) ×3 IMPLANT
WATER STERILE IRR 1000ML POUR (IV SOLUTION) ×2 IMPLANT
WATER STERILE IRR 500ML POUR (IV SOLUTION) ×3 IMPLANT

## 2021-10-07 NOTE — Progress Notes (Signed)
MD ordered patient to be discharged home.  Discharge instructions were reviewed with the patient and wife and they voiced understanding.  Follow-up appointment was made.  Prescriptions sent to the patients pharmacy.  IV was removed with catheter intact.  Patient educated on removal of stent.  All patients questions were answered.  Patient left via wheelchair.

## 2021-10-07 NOTE — Discharge Instructions (Addendum)
You have a ureteral stent in place.  This is a tube that extends from your kidney to your bladder.  This may cause urinary bleeding, burning with urination, and urinary frequency.  Please call our office or present to the ED if you develop fevers >101 or pain which is not able to be controlled with oral pain medications.  You may be given either Flomax and/ or ditropan to help with bladder spasms and stent pain in addition to pain medications.    Your stent is on a string.  It is taped to the head of your penis.  On Thursday morning, you may untape this and pull gently until the entire stent is removed.  If you have any due to difficulties doing so, please contact our office and we will be glad to [place] [address], [place], [place] [address] [phone]  [place]   The drugs that you were given will stay in your system until tomorrow so for the next 24 hours you should not:  Drive an automobile Make any legal decisions Drink any alcoholic beverage   You may resume regular meals tomorrow.  Today it is better to start with liquids and gradually work up to solid foods.  You may eat anything you prefer, but it is better to start with liquids, then soup and crackers, and gradually work up to solid foods.   Please notify your doctor immediately if you have any unusual bleeding, trouble breathing, redness and pain at the surgery site, drainage, fever, or pain not relieved by medication.    Additional Instructions:        Please contact your physician with any problems or Same Day Surgery at [phone], Monday through Friday 6 am to 4 pm, or [place] at [facility] number at [phone].

## 2021-10-07 NOTE — Interval H&P Note (Signed)
History and Physical Interval Note:  10/07/2021 10:02 AM  [name]  has presented today for surgery, with the diagnosis of Left Ureteral Stone.  The various methods of treatment have been discussed with the patient and family. After consideration of risks, benefits and other options for treatment, the patient has consented to  Procedure(s): CYSTOSCOPY/URETEROSCOPY/HOLMIUM LASER/STENT PLACEMENT (Left) CYSTOSCOPY WITH RETROGRADE PYELOGRAM as a surgical intervention.  The patient's history has been reviewed, patient examined, no change in status, stable for surgery.  I have reviewed the patient's chart and labs.  Questions were answered to the patient's satisfaction.    [facility] CTAB   [name]

## 2021-10-07 NOTE — Op Note (Signed)
Date of procedure: 10/07/21  Preoperative diagnosis:  Left distal ureteral calculus  Postoperative diagnosis:  Same as above  Procedure: Left ureteroscopy with laser lithotripsy Basket extraction of stone fragment Left retrograde pyelogram Left ureteral stent placement Interpretation of fluoroscopy less than 30 minutes  Surgeon: Hollice Espy, MD  Anesthesia: General  Complications: None  Intraoperative findings: 6 mm left distal ureteral calculus identified and obliterated.  All fragments removed.  Stent left on tether.  EBL: Minimal  Specimens: None  Drains: 6 x 26 French double-J ureteral stent on left with tether  Indication: [name] is a 66 y.o. patient with 6 mm left distal ureteral calculus who failed ESWL.  After reviewing the management options for treatment, he elected to proceed with the above surgical procedure(s). We have discussed the potential benefits and risks of the procedure, side effects of the proposed treatment, the likelihood of the patient achieving the goals of the procedure, and any potential problems that might occur during the procedure or recuperation. Informed consent has been obtained.  Description of procedure:  The patient was taken to the operating room and general anesthesia was induced.  The patient was placed in the dorsal lithotomy position, prepped and draped in the usual sterile fashion, and preoperative antibiotics were administered. A preoperative time-out was performed.   21 [place] scope was advanced per urethra into the bladder.  On scout imaging, the stone was not readily apparent.  Attention was turned to the left UO.  Was cannulated using an open-ended ureteral catheter.  Gentle contrast was injected which did reveal filling defect within the left distal ureter with some proximal dilation consistent with retained stone.  A wire was then placed up to the level of the kidney.  It was snapped in place as a safety wire.  A semirigid  ureteroscope was then brought in to the level of the stone which was identified.  A 242 m laser fiber was then brought in and using dusting settings of 0.3 J and 60 Hz, the stone was dusted.  I used a 1.9 French tipless nitinol basket to remove the remaining fragments.  I was able to scope the ureter all the way up to the proximal ureter and no significant residual fragments remained.  There was 1 small area in the distal ureter with some very superficial laser trauma was identified, otherwise there was minimal edema and no residual fragments.  There was no contrast extravasation.  Finally, I placed a 6 x 26 French double-J ureteral stent fluoroscopically over the wire up to the level of the kidney.  Upon wire removal, there was a full coil both within the renal pelvis as well as within the bladder.  The bladder was drained.  The stent string was left attached to the distal coil the stent which was affixed to the patient's glans using Mastisol and Tegaderm.  He was then cleaned and dried, repositioned in supine position, reversed from anesthesia and taken to the PACU in stable condition.  Plan: He will follow-up with me in 1 month with a renal ultrasound prior.  He will remove his own stent on Thursday.  Hollice Espy, M.D.

## 2021-10-07 NOTE — Anesthesia Procedure Notes (Signed)
Procedure Name: LMA Insertion Date/Time: 10/07/2021 11:33 AM Performed by: Cammie Sickle, CRNA Pre-anesthesia Checklist: Patient identified, Patient being monitored, Timeout performed, Emergency Drugs available and Suction available Patient Re-evaluated:Patient Re-evaluated prior to induction Oxygen Delivery Method: Circle system utilized Preoxygenation: Pre-oxygenation with 100% oxygen Induction Type: IV induction Ventilation: Mask ventilation without difficulty LMA: LMA inserted LMA Size: 5.0 Tube type: Oral Number of attempts: 1 Placement Confirmation: positive ETCO2 and breath sounds checked- equal and bilateral Tube secured with: Tape Dental Injury: Teeth and Oropharynx as per pre-operative assessment

## 2021-10-07 NOTE — Anesthesia Preprocedure Evaluation (Signed)
Anesthesia Evaluation  Patient identified by MRN, date of birth, ID band Patient awake    Reviewed: Allergy & Precautions, NPO status , Patient's Chart, lab work & pertinent test results  History of Anesthesia Complications Negative for: history of anesthetic complications  Airway Mallampati: II  TM Distance: >3 FB Neck ROM: Full    Dental  (+) Partial Upper, Chipped, Implants   Pulmonary neg pulmonary ROS, neg sleep apnea, neg COPD, Patient abstained from smoking.Not current smoker,    Pulmonary exam normal breath sounds clear to auscultation       Cardiovascular Exercise Tolerance: Good METShypertension, Pt. on medications (-) CAD and (-) Past MI (-) dysrhythmias  Rhythm:Regular Rate:Normal - Systolic murmurs    Neuro/Psych negative neurological ROS  negative psych ROS   GI/Hepatic neg GERD  ,(+)     (-) substance abuse  ,   Endo/Other  diabetes  Renal/GU negative Renal ROS     Musculoskeletal   Abdominal   Peds  Hematology   Anesthesia Other Findings Past Medical History: No date: History of kidney stones No date: Hyperlipidemia No date: Hypertension No date: Internal derangement of knee involving posterior horn of  lateral meniscus 02/05/2016: Melanoma ([place])     Comment:  right upper arm No date: Obesity 2015: Type 2 diabetes mellitus (HCC)  Reproductive/Obstetrics                             Anesthesia Physical Anesthesia Plan  ASA: 2  Anesthesia Plan: General   Post-op Pain Management: Ofirmev IV (intra-op)* and Toradol IV (intra-op)*   Induction: Intravenous  PONV Risk Score and Plan: 3 and Ondansetron, Dexamethasone and Midazolam  Airway Management Planned: LMA  Additional Equipment: None  Intra-op Plan:   Post-operative Plan: Extubation in OR  Informed Consent: I have reviewed the patients History and Physical, chart, labs and discussed the procedure  including the risks, benefits and alternatives for the proposed anesthesia with the patient or authorized representative who has indicated his/her understanding and acceptance.     Dental advisory given  Plan Discussed with: CRNA and Surgeon  Anesthesia Plan Comments: (Discussed risks of anesthesia with patient, including PONV, sore throat, lip/dental/eye damage. Rare risks discussed as well, such as cardiorespiratory and neurological sequelae, and allergic reactions. Discussed the role of CRNA in patient's perioperative care. Patient understands.)        Anesthesia Quick Evaluation

## 2021-10-07 NOTE — Anesthesia Postprocedure Evaluation (Signed)
Anesthesia Post Note  Patient: [name]  Procedure(s) Performed: CYSTOSCOPY/URETEROSCOPY/HOLMIUM LASER/STENT PLACEMENT (Left) CYSTOSCOPY WITH RETROGRADE PYELOGRAM  Patient location during evaluation: PACU Anesthesia Type: General Level of consciousness: awake and alert Pain management: pain level controlled Vital Signs Assessment: post-procedure vital signs reviewed and stable Respiratory status: spontaneous breathing, nonlabored ventilation, respiratory function stable and patient connected to nasal cannula oxygen Cardiovascular status: blood pressure returned to baseline and stable Postop Assessment: no apparent nausea or vomiting Anesthetic complications: no   No notable events documented.   Last Vitals:  Vitals:   10/07/21 1155 10/07/21 1201  BP:  (!) 146/86  Pulse: 86 78  Resp: 16 16  Temp:  (!) 36.1 C  SpO2: 96% 96%    Last Pain:  Vitals:   10/07/21 1201  TempSrc: Temporal  PainSc: 2                  Arita Miss

## 2021-10-07 NOTE — Transfer of Care (Signed)
Immediate Anesthesia Transfer of Care Note  Patient: [name]  Procedure(s) Performed: CYSTOSCOPY/URETEROSCOPY/HOLMIUM LASER/STENT PLACEMENT (Left) CYSTOSCOPY WITH RETROGRADE PYELOGRAM  Patient Location: PACU  Anesthesia Type:General  Level of Consciousness: drowsy  Airway & Oxygen Therapy: Patient Spontanous Breathing and Patient connected to face mask oxygen  Post-op Assessment: Report given to RN and Post -op Vital signs reviewed and stable  Post vital signs: Reviewed and stable  Last Vitals:  Vitals Value Taken Time  BP 133/78 10/07/21 1120  Temp 36.1   Pulse 88 10/07/21 1120  Resp 15 10/07/21 1120  SpO2 98 % 10/07/21 1120  Vitals shown include unvalidated device data.  Last Pain:  Vitals:   10/07/21 0934  TempSrc: Oral  PainSc: 0-No pain         Complications: No notable events documented.

## 2021-10-08 ENCOUNTER — Encounter: Payer: Self-pay | Admitting: Urology

## 2021-11-01 ENCOUNTER — Telehealth: Payer: Self-pay | Admitting: *Deleted

## 2021-11-01 NOTE — Telephone Encounter (Signed)
Attempted to leave VM unable to reach patient, attempted to reach Spouse listed on DPR. Patient needs RUS. ?

## 2021-11-05 ENCOUNTER — Ambulatory Visit: Payer: Commercial Managed Care - PPO | Admitting: Urology

## 2023-03-10 IMAGING — CT CT RENAL STONE PROTOCOL
2 of 4 series · 16 of 46 positions shown, 18 images · non-contrast
Comparison: None.

CLINICAL DATA: Left lower abdominal pain

EXAM:
CT ABDOMEN AND PELVIS WITHOUT CONTRAST
TECHNIQUE: Multidetector CT imaging of the abdomen and pelvis was performed
following the standard protocol without IV contrast.

[Series 2: stone full standard · axial · 0.85mm/px · z∈[-975,-515]mm · 13 of 102 slices shown, 15 images]
[im 5/102  soft-tissue]
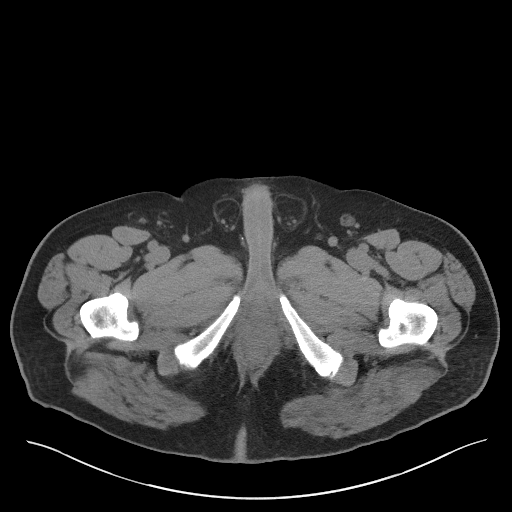
[im 5/102  bone]
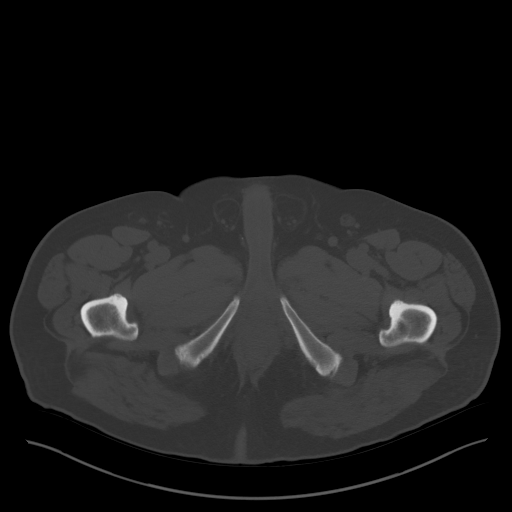
[im 14/102  soft-tissue]
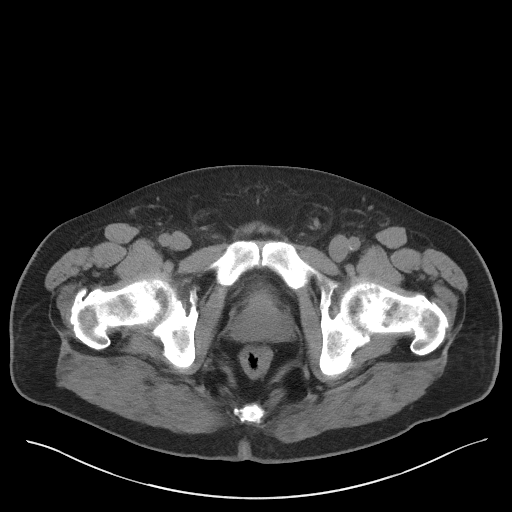
[im 23/102  soft-tissue]
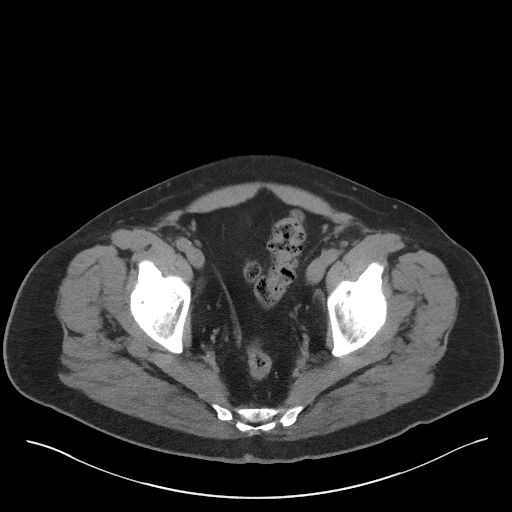
[im 28/102  soft-tissue]
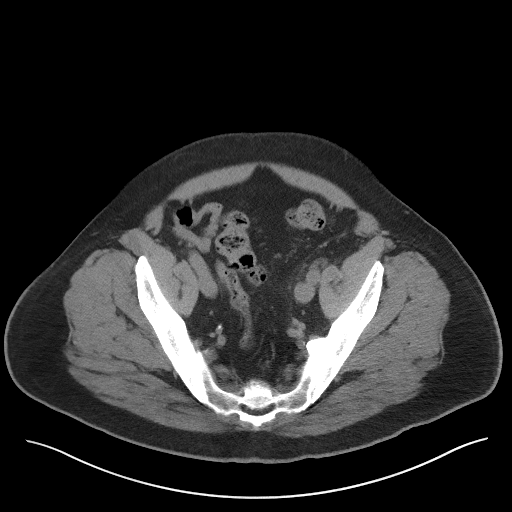
[im 37/102  soft-tissue]
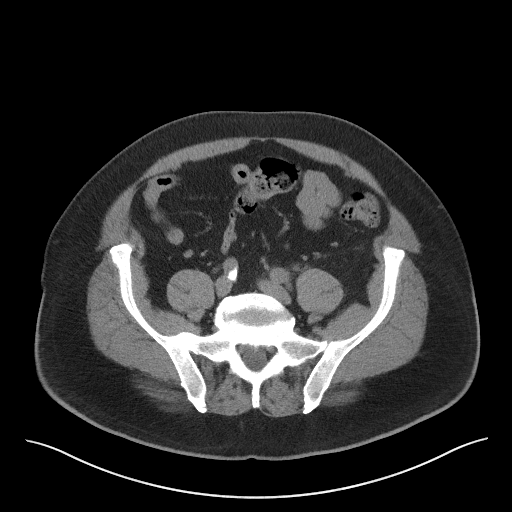
[im 42/102  soft-tissue]
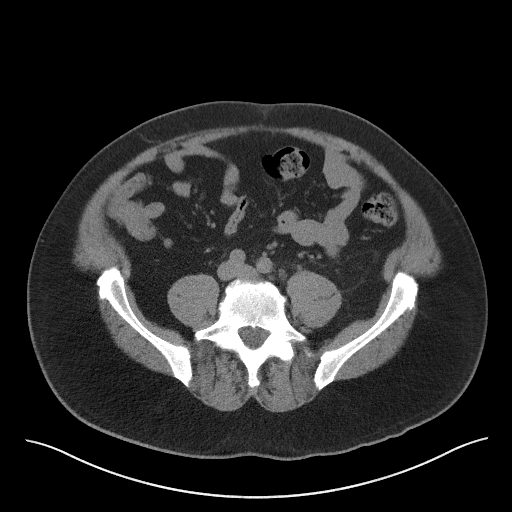
[im 51/102  soft-tissue]
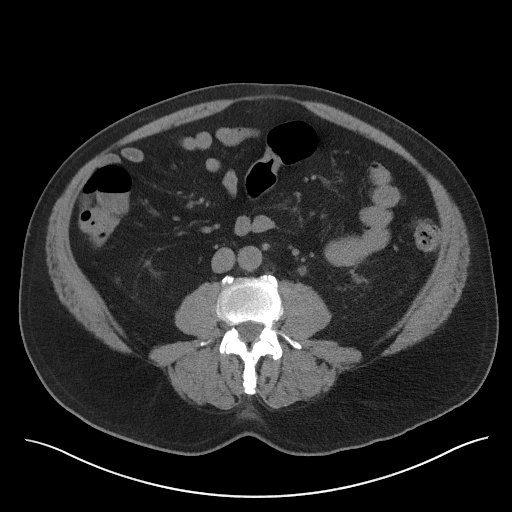
[im 60/102  soft-tissue]
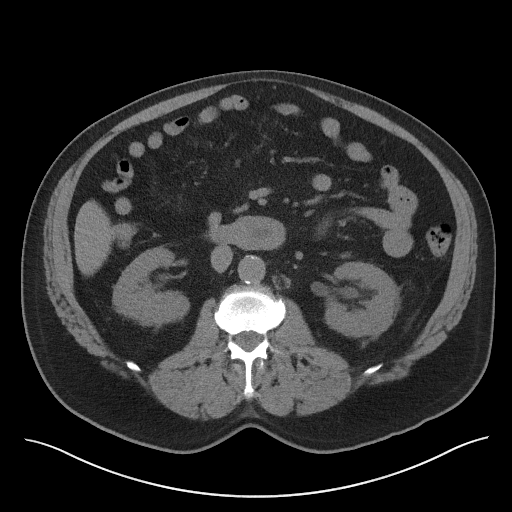
[im 65/102  soft-tissue]
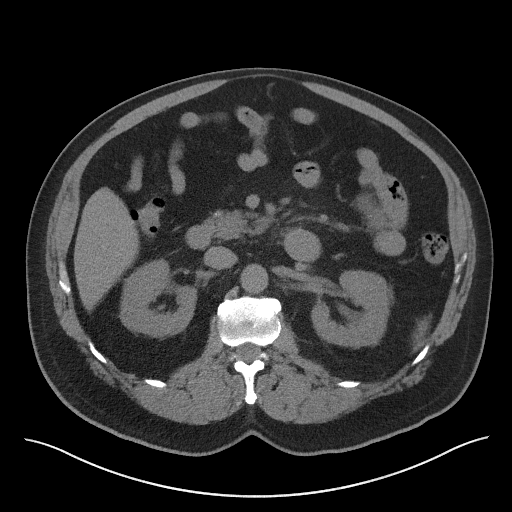
[im 65/102  bone]
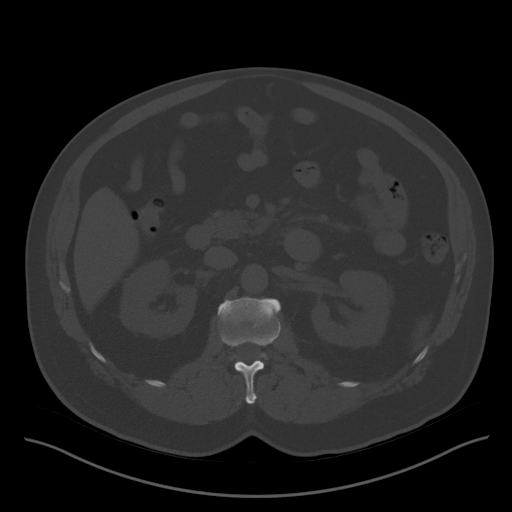
[im 74/102  soft-tissue]
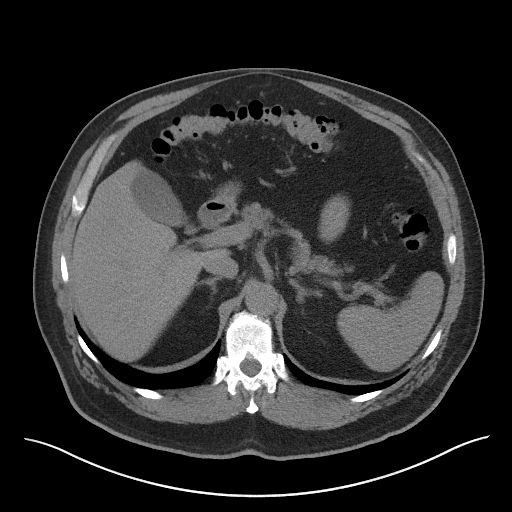
[im 79/102  soft-tissue]
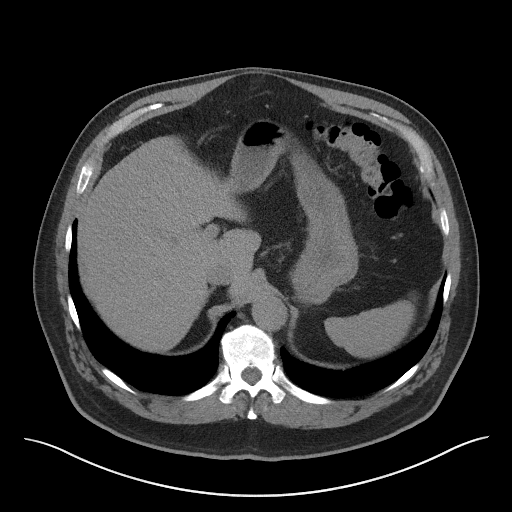
[im 88/102  soft-tissue]
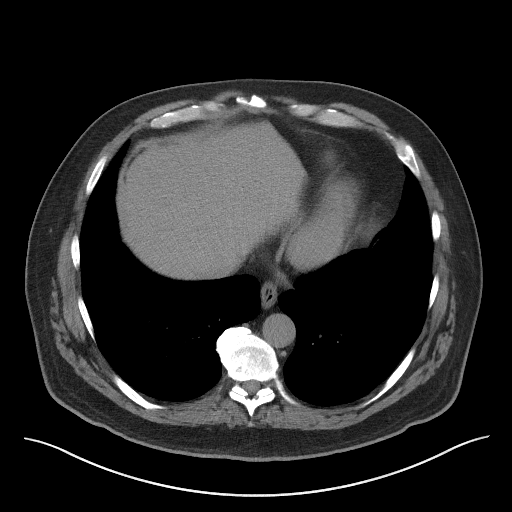
[im 97/102  soft-tissue]
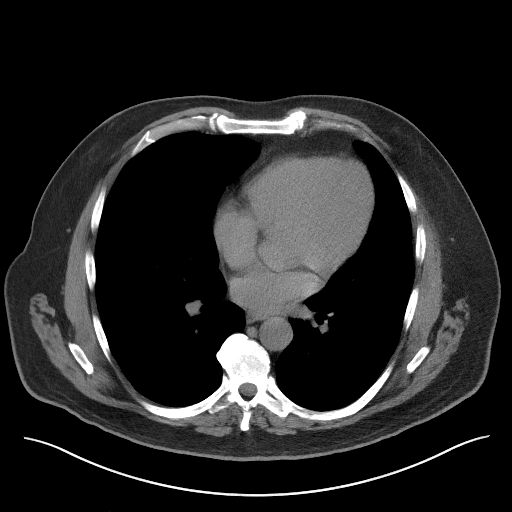

[Series 5: coronal · coronal · 0.82mm/px · 3 of 169 slices shown]
[im 57/169  soft-tissue]
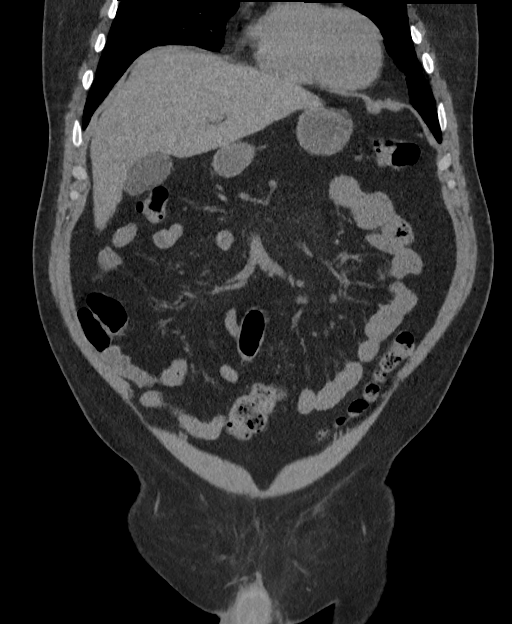
[im 75/169  soft-tissue]
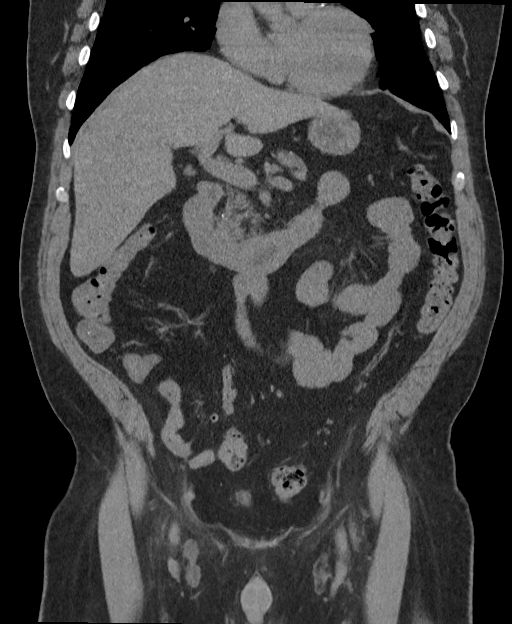
[im 94/169  soft-tissue]
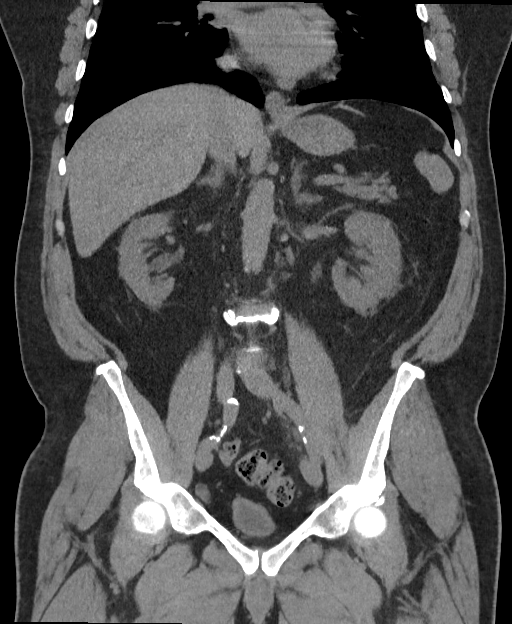

[16 of 46 positions shown; findings below may reference images not displayed]

FINDINGS: Lower chest: No acute abnormality.  Coronary vascular calcification.

Hepatobiliary: No focal liver abnormality is seen. No gallstones,
gallbladder wall thickening, or biliary dilatation.

Pancreas: Unremarkable. No pancreatic ductal dilatation or
surrounding inflammatory changes.

Spleen: Normal in size without focal abnormality.

Adrenals/Urinary Tract: Adrenal glands are normal. Mild left
hydronephrosis and hydroureter, secondary to a 3 x 6 mm stone in the
proximal left ureter about 4 cm distal to the left UPJ. The urinary
bladder is unremarkable.

Stomach/Bowel: Stomach is within normal limits. Appendix appears
normal. No evidence of bowel wall thickening, distention, or
inflammatory changes.

Vascular/Lymphatic: Mild aortic atherosclerosis. No aneurysm. No
suspicious nodes.

Reproductive: Prostate is unremarkable.

Other: Negative for pelvic effusion or free air. Small fat
containing inguinal hernias. Small fat containing periumbilical
hernia.

Musculoskeletal: No acute or significant osseous findings.
IMPRESSION: 1. Mild left hydronephrosis and hydroureter secondary to a 3 x 6 mm
stone in the proximal left ureter about 4 cm distal to the left UVJ.

These results will be called to the ordering clinician or
representative by the Radiologist Assistant, and communication
documented in the PACS or [REDACTED].

## 2023-04-12 IMAGING — US US RENAL
1 series · 14 of 25 positions shown · non-contrast
Comparison: None.

CLINICAL DATA: Left ureteral stone

EXAM:
RENAL / URINARY TRACT ULTRASOUND COMPLETE

[Series 1: us renal · 0.23mm/px · 14 of 44 slices shown]
[im 1/44]
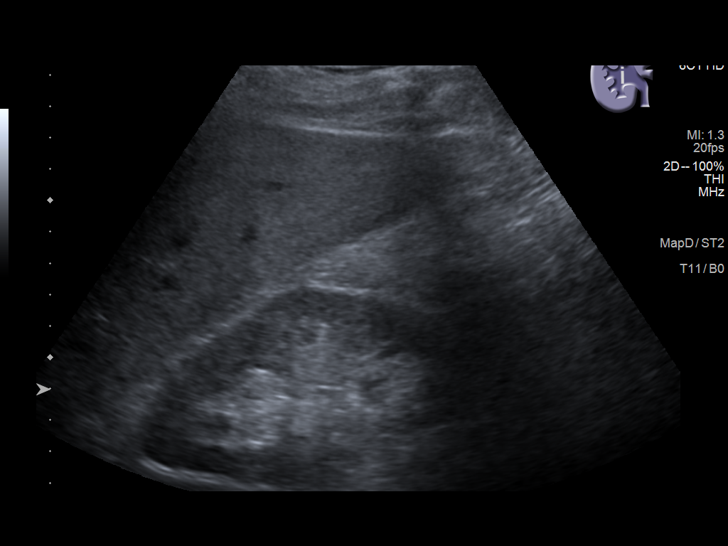
[im 4/44]
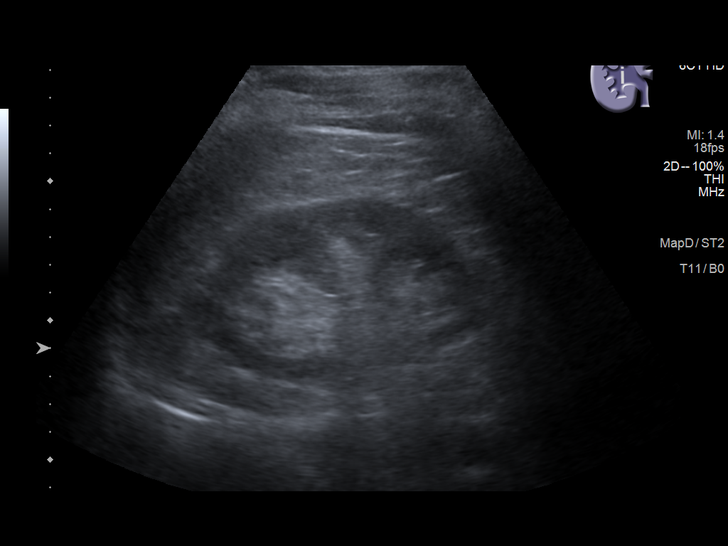
[im 8/44]
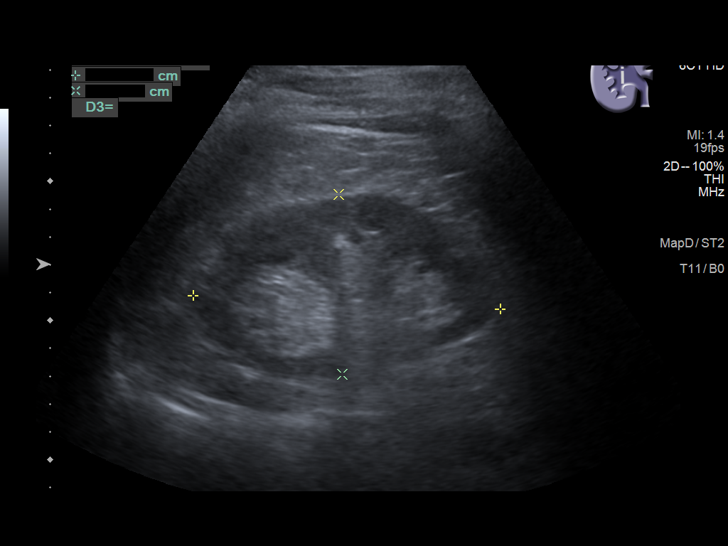
[im 11/44]
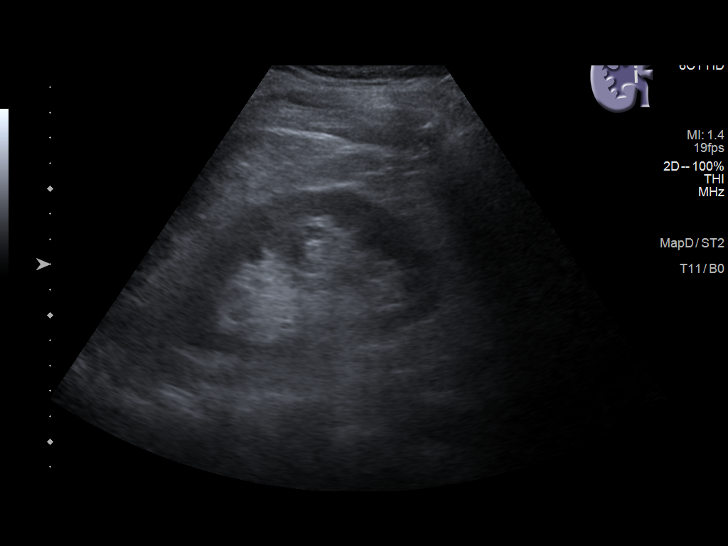
[im 15/44]
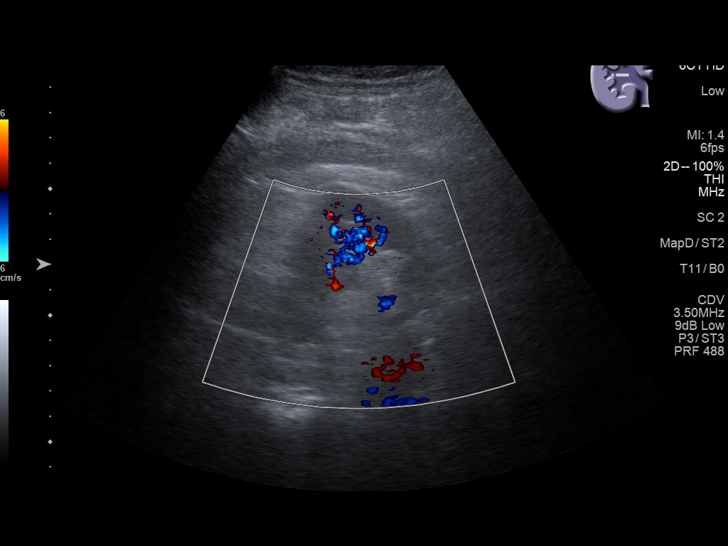
[im 17/44]
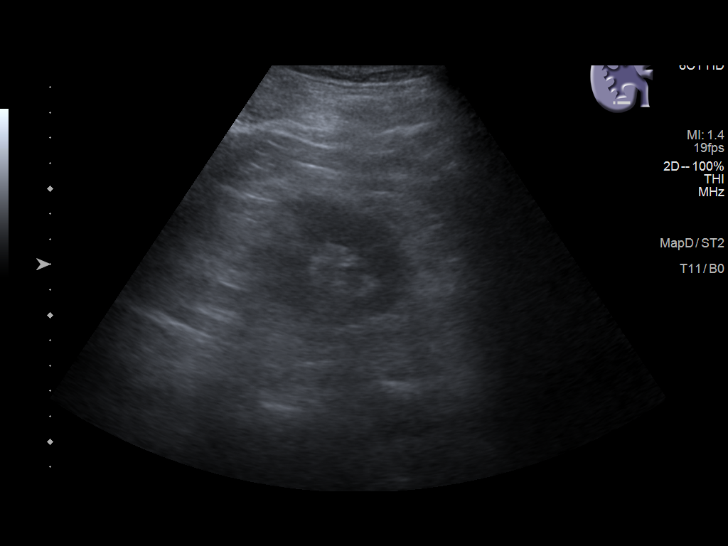
[im 20/44]
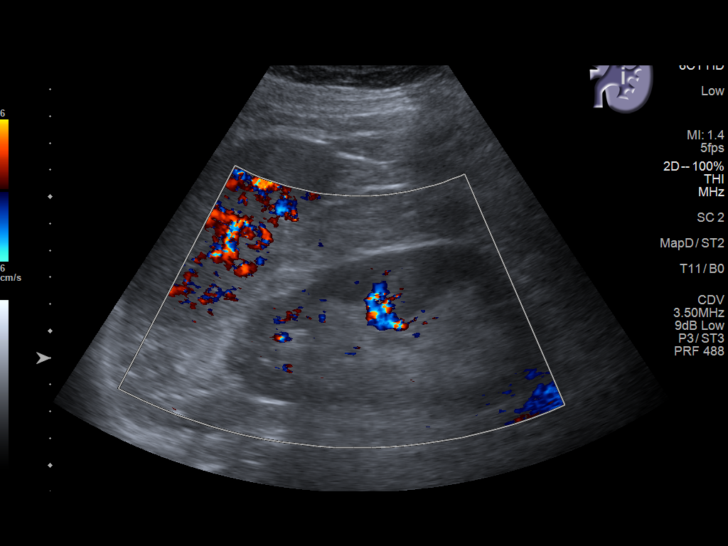
[im 24/44]
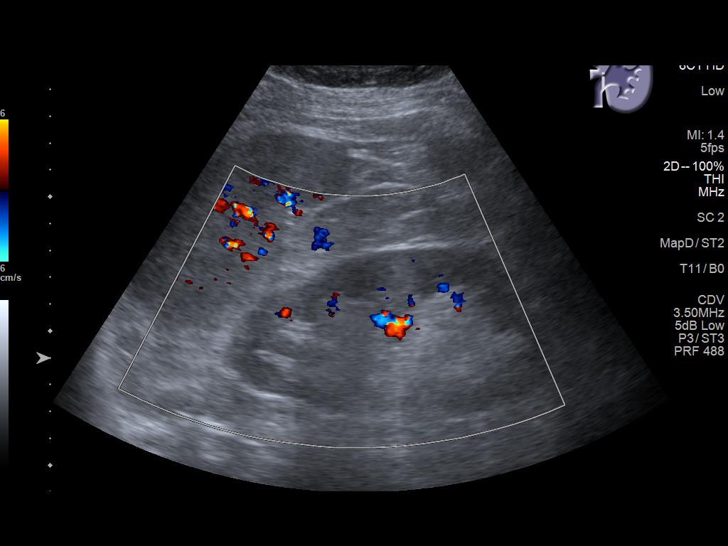
[im 27/44]
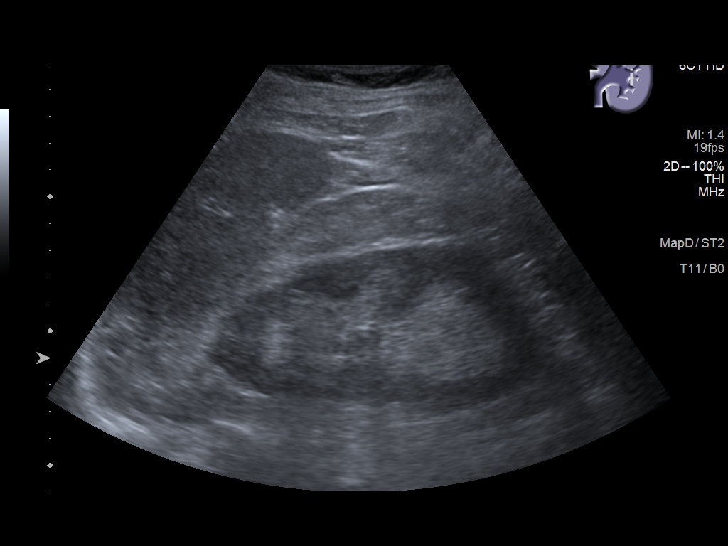
[im 29/44]
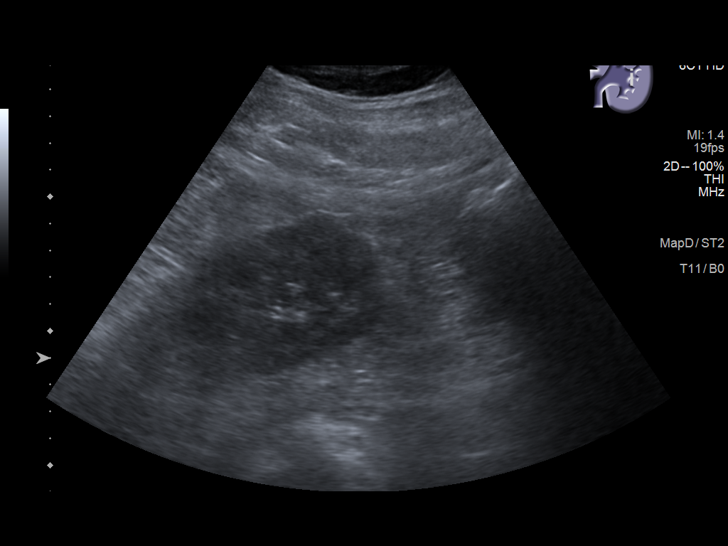
[im 33/44]
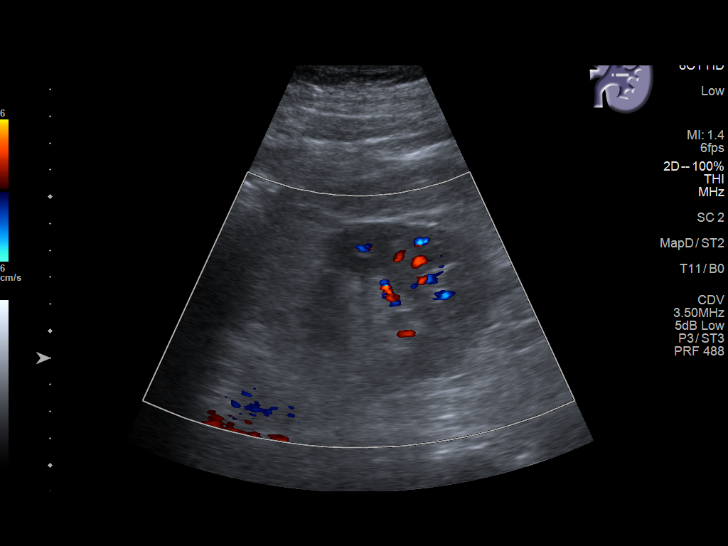
[im 36/44]
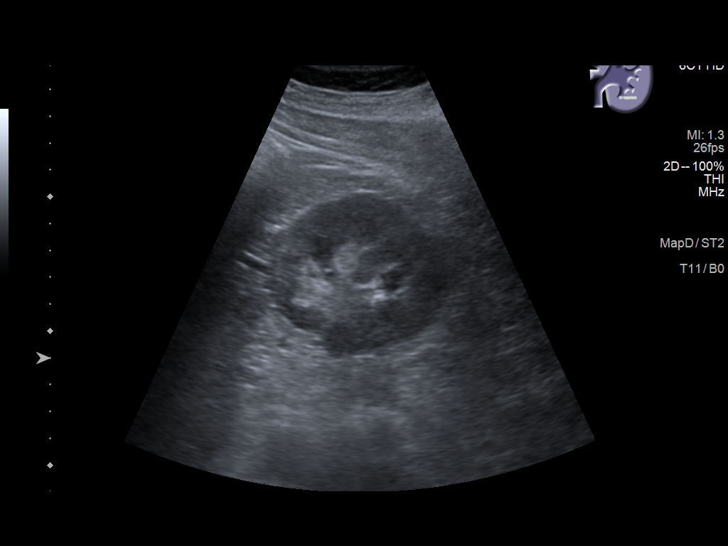
[im 40/44]
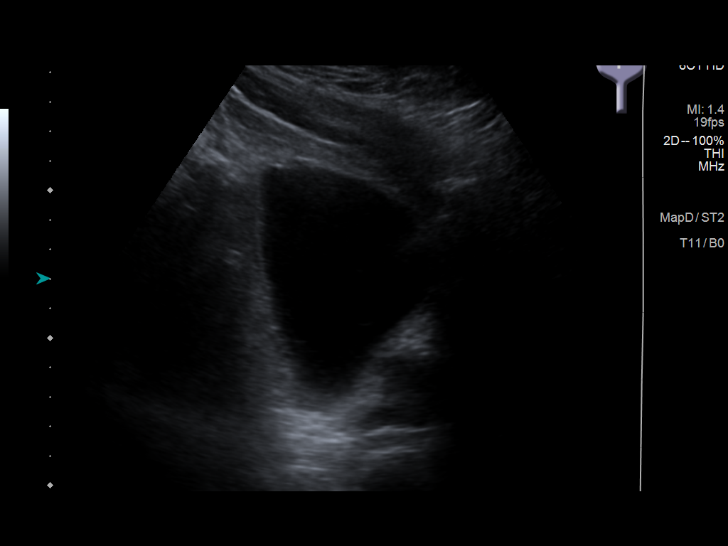
[im 44/44]
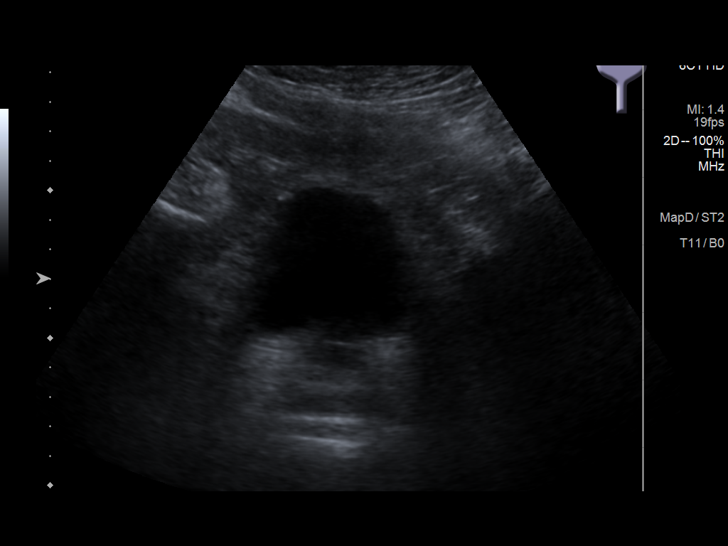

[14 of 25 positions shown; findings below may reference images not displayed]

FINDINGS: Right Kidney:

Renal measurements: 11 x 6.5 x 5.3 cm = volume: 199.4 mL.
Echogenicity within normal limits. No mass or hydronephrosis
visualized.

Left Kidney:

Renal measurements: 12.4 x 5.8 x 5.1 cm = volume: 190.4 mL.
Echogenicity within normal limits. No mass or hydronephrosis
visualized.

Bladder:

Appears normal for degree of bladder distention.

Other:

None.
IMPRESSION: No hydronephrosis or proximal hydroureter.

## 2023-05-06 ENCOUNTER — Emergency Department
Admission: EM | Admit: 2023-05-06 | Discharge: 2023-05-06 | Disposition: A | Discharge status: Home / Self Care | Payer: Worker's Compensation | Attending: Student in an Organized Health Care Education/Training Program | Admitting: Student in an Organized Health Care Education/Training Program

## 2023-05-06 DIAGNOSIS — Y990 Civilian activity done for income or pay: Secondary | ICD-10-CM | POA: Insufficient documentation

## 2023-05-06 DIAGNOSIS — S0990XA Unspecified injury of head, initial encounter: Secondary | ICD-10-CM | POA: Insufficient documentation

## 2023-05-06 DIAGNOSIS — W1789XA Other fall from one level to another, initial encounter: Secondary | ICD-10-CM | POA: Diagnosis not present

## 2023-05-06 LAB — URINE DRUG SCREEN, QUALITATIVE (ARMC ONLY)
Amphetamines, Ur Screen: NOT DETECTED
Barbiturates, Ur Screen: NOT DETECTED
Benzodiazepine, Ur Scrn: NOT DETECTED
Cannabinoid 50 Ng, Ur ~~LOC~~: NOT DETECTED
Cocaine Metabolite,Ur ~~LOC~~: NOT DETECTED
MDMA (Ecstasy)Ur Screen: NOT DETECTED
Methadone Scn, Ur: NOT DETECTED
Opiate, Ur Screen: NOT DETECTED
Phencyclidine (PCP) Ur S: NOT DETECTED
Tricyclic, Ur Screen: NOT DETECTED
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
## 2023-05-06 NOTE — ED Notes (Signed)
Pt d/c home per MD order, discharge summary reviewed with pt, pt verbalizes understanding. Ambulatory off unit. No s/s of acute distress noted at discharge.
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
